# Patient Record
Sex: Female | Born: 1950 | Race: White | Hispanic: No | Smoking: Former smoker
Health system: Southern US, Community
[De-identification: ages and names within clinical notes are randomized; demographics above are authoritative.]

## PROBLEM LIST (undated history)

## (undated) DIAGNOSIS — E119 Type 2 diabetes mellitus without complications: Secondary | ICD-10-CM

## (undated) DIAGNOSIS — I679 Cerebrovascular disease, unspecified: Secondary | ICD-10-CM

## (undated) DIAGNOSIS — I2510 Atherosclerotic heart disease of native coronary artery without angina pectoris: Secondary | ICD-10-CM

## (undated) DIAGNOSIS — G20A1 Parkinson's disease without dyskinesia, without mention of fluctuations: Secondary | ICD-10-CM

## (undated) DIAGNOSIS — E785 Hyperlipidemia, unspecified: Secondary | ICD-10-CM

## (undated) DIAGNOSIS — G20 Parkinson's disease: Secondary | ICD-10-CM

## (undated) DIAGNOSIS — F0390 Unspecified dementia without behavioral disturbance: Secondary | ICD-10-CM

## (undated) HISTORY — DX: Parkinson's disease: G20

## (undated) HISTORY — DX: Parkinson's disease without dyskinesia, without mention of fluctuations: G20.A1

## (undated) HISTORY — PX: TONSILLECTOMY: SUR1361

## (undated) HISTORY — PX: TUBAL LIGATION: SHX77

## (undated) HISTORY — PX: THYROIDECTOMY: SHX17

## (undated) HISTORY — DX: Hyperlipidemia, unspecified: E78.5

## (undated) HISTORY — DX: Atherosclerotic heart disease of native coronary artery without angina pectoris: I25.10

## (undated) HISTORY — DX: Unspecified dementia, unspecified severity, without behavioral disturbance, psychotic disturbance, mood disturbance, and anxiety: F03.90

## (undated) HISTORY — DX: Cerebrovascular disease, unspecified: I67.9

## (undated) HISTORY — PX: APPENDECTOMY: SHX54

## (undated) HISTORY — PX: EXCISION MASS HEAD: SHX6702

## (undated) HISTORY — PX: EXPLORATORY LAPAROTOMY: SUR591

---

## 2016-05-01 DIAGNOSIS — F4489 Other dissociative and conversion disorders: Secondary | ICD-10-CM | POA: Diagnosis not present

## 2016-05-01 DIAGNOSIS — F329 Major depressive disorder, single episode, unspecified: Secondary | ICD-10-CM | POA: Diagnosis not present

## 2016-05-02 DIAGNOSIS — R05 Cough: Secondary | ICD-10-CM | POA: Diagnosis not present

## 2016-05-02 DIAGNOSIS — I110 Hypertensive heart disease with heart failure: Secondary | ICD-10-CM | POA: Diagnosis not present

## 2016-05-02 DIAGNOSIS — Z8673 Personal history of transient ischemic attack (TIA), and cerebral infarction without residual deficits: Secondary | ICD-10-CM | POA: Diagnosis not present

## 2016-05-02 DIAGNOSIS — Z885 Allergy status to narcotic agent status: Secondary | ICD-10-CM | POA: Diagnosis not present

## 2016-05-02 DIAGNOSIS — R509 Fever, unspecified: Secondary | ICD-10-CM | POA: Diagnosis not present

## 2016-05-02 DIAGNOSIS — E079 Disorder of thyroid, unspecified: Secondary | ICD-10-CM | POA: Diagnosis not present

## 2016-05-02 DIAGNOSIS — Z794 Long term (current) use of insulin: Secondary | ICD-10-CM | POA: Diagnosis not present

## 2016-05-02 DIAGNOSIS — K219 Gastro-esophageal reflux disease without esophagitis: Secondary | ICD-10-CM | POA: Diagnosis not present

## 2016-05-02 DIAGNOSIS — E119 Type 2 diabetes mellitus without complications: Secondary | ICD-10-CM | POA: Diagnosis not present

## 2016-05-02 DIAGNOSIS — R0981 Nasal congestion: Secondary | ICD-10-CM | POA: Diagnosis not present

## 2016-05-02 DIAGNOSIS — F0390 Unspecified dementia without behavioral disturbance: Secondary | ICD-10-CM | POA: Diagnosis not present

## 2016-05-02 DIAGNOSIS — I509 Heart failure, unspecified: Secondary | ICD-10-CM | POA: Diagnosis not present

## 2016-05-02 DIAGNOSIS — Z7982 Long term (current) use of aspirin: Secondary | ICD-10-CM | POA: Diagnosis not present

## 2016-05-02 DIAGNOSIS — Z90710 Acquired absence of both cervix and uterus: Secondary | ICD-10-CM | POA: Diagnosis not present

## 2016-05-02 DIAGNOSIS — E785 Hyperlipidemia, unspecified: Secondary | ICD-10-CM | POA: Diagnosis not present

## 2016-05-02 DIAGNOSIS — Z9049 Acquired absence of other specified parts of digestive tract: Secondary | ICD-10-CM | POA: Diagnosis not present

## 2016-05-02 DIAGNOSIS — I10 Essential (primary) hypertension: Secondary | ICD-10-CM | POA: Diagnosis not present

## 2016-05-02 DIAGNOSIS — B349 Viral infection, unspecified: Secondary | ICD-10-CM | POA: Diagnosis not present

## 2016-05-02 DIAGNOSIS — B1920 Unspecified viral hepatitis C without hepatic coma: Secondary | ICD-10-CM | POA: Diagnosis not present

## 2016-05-03 DIAGNOSIS — R4182 Altered mental status, unspecified: Secondary | ICD-10-CM | POA: Diagnosis not present

## 2016-05-12 DIAGNOSIS — M79674 Pain in right toe(s): Secondary | ICD-10-CM | POA: Diagnosis not present

## 2016-05-12 DIAGNOSIS — M205X1 Other deformities of toe(s) (acquired), right foot: Secondary | ICD-10-CM | POA: Diagnosis not present

## 2016-05-12 DIAGNOSIS — M19071 Primary osteoarthritis, right ankle and foot: Secondary | ICD-10-CM | POA: Diagnosis not present

## 2016-05-12 DIAGNOSIS — E1151 Type 2 diabetes mellitus with diabetic peripheral angiopathy without gangrene: Secondary | ICD-10-CM | POA: Diagnosis not present

## 2016-05-18 DIAGNOSIS — E1121 Type 2 diabetes mellitus with diabetic nephropathy: Secondary | ICD-10-CM | POA: Diagnosis not present

## 2016-05-18 DIAGNOSIS — I129 Hypertensive chronic kidney disease with stage 1 through stage 4 chronic kidney disease, or unspecified chronic kidney disease: Secondary | ICD-10-CM | POA: Diagnosis not present

## 2016-05-18 DIAGNOSIS — N183 Chronic kidney disease, stage 3 (moderate): Secondary | ICD-10-CM | POA: Diagnosis not present

## 2016-05-18 DIAGNOSIS — R609 Edema, unspecified: Secondary | ICD-10-CM | POA: Diagnosis not present

## 2016-05-18 DIAGNOSIS — E213 Hyperparathyroidism, unspecified: Secondary | ICD-10-CM | POA: Diagnosis not present

## 2016-05-23 DIAGNOSIS — I2510 Atherosclerotic heart disease of native coronary artery without angina pectoris: Secondary | ICD-10-CM | POA: Diagnosis not present

## 2016-05-23 DIAGNOSIS — E559 Vitamin D deficiency, unspecified: Secondary | ICD-10-CM | POA: Diagnosis not present

## 2016-05-23 DIAGNOSIS — C73 Malignant neoplasm of thyroid gland: Secondary | ICD-10-CM | POA: Diagnosis not present

## 2016-05-23 DIAGNOSIS — E1121 Type 2 diabetes mellitus with diabetic nephropathy: Secondary | ICD-10-CM | POA: Diagnosis not present

## 2016-05-23 DIAGNOSIS — Z23 Encounter for immunization: Secondary | ICD-10-CM | POA: Diagnosis not present

## 2016-05-31 DIAGNOSIS — F419 Anxiety disorder, unspecified: Secondary | ICD-10-CM | POA: Diagnosis not present

## 2016-05-31 DIAGNOSIS — R419 Unspecified symptoms and signs involving cognitive functions and awareness: Secondary | ICD-10-CM | POA: Diagnosis not present

## 2016-05-31 DIAGNOSIS — F329 Major depressive disorder, single episode, unspecified: Secondary | ICD-10-CM | POA: Diagnosis not present

## 2016-06-06 DIAGNOSIS — I10 Essential (primary) hypertension: Secondary | ICD-10-CM | POA: Diagnosis not present

## 2016-06-06 DIAGNOSIS — E119 Type 2 diabetes mellitus without complications: Secondary | ICD-10-CM | POA: Diagnosis not present

## 2016-06-06 DIAGNOSIS — E785 Hyperlipidemia, unspecified: Secondary | ICD-10-CM | POA: Diagnosis not present

## 2016-06-06 DIAGNOSIS — N189 Chronic kidney disease, unspecified: Secondary | ICD-10-CM | POA: Diagnosis not present

## 2016-06-06 DIAGNOSIS — I2510 Atherosclerotic heart disease of native coronary artery without angina pectoris: Secondary | ICD-10-CM | POA: Diagnosis not present

## 2016-06-06 DIAGNOSIS — Z9861 Coronary angioplasty status: Secondary | ICD-10-CM | POA: Diagnosis not present

## 2016-06-08 DIAGNOSIS — R609 Edema, unspecified: Secondary | ICD-10-CM | POA: Diagnosis not present

## 2016-06-08 DIAGNOSIS — I129 Hypertensive chronic kidney disease with stage 1 through stage 4 chronic kidney disease, or unspecified chronic kidney disease: Secondary | ICD-10-CM | POA: Diagnosis not present

## 2016-06-08 DIAGNOSIS — E213 Hyperparathyroidism, unspecified: Secondary | ICD-10-CM | POA: Diagnosis not present

## 2016-06-08 DIAGNOSIS — N183 Chronic kidney disease, stage 3 (moderate): Secondary | ICD-10-CM | POA: Diagnosis not present

## 2016-06-08 DIAGNOSIS — E1121 Type 2 diabetes mellitus with diabetic nephropathy: Secondary | ICD-10-CM | POA: Diagnosis not present

## 2016-06-15 DIAGNOSIS — E892 Postprocedural hypoparathyroidism: Secondary | ICD-10-CM | POA: Diagnosis not present

## 2016-06-15 DIAGNOSIS — M810 Age-related osteoporosis without current pathological fracture: Secondary | ICD-10-CM | POA: Diagnosis not present

## 2016-06-15 DIAGNOSIS — E1165 Type 2 diabetes mellitus with hyperglycemia: Secondary | ICD-10-CM | POA: Diagnosis not present

## 2016-06-15 DIAGNOSIS — Z85850 Personal history of malignant neoplasm of thyroid: Secondary | ICD-10-CM | POA: Diagnosis not present

## 2016-06-15 DIAGNOSIS — E890 Postprocedural hypothyroidism: Secondary | ICD-10-CM | POA: Diagnosis not present

## 2016-06-21 DIAGNOSIS — I2510 Atherosclerotic heart disease of native coronary artery without angina pectoris: Secondary | ICD-10-CM | POA: Diagnosis not present

## 2016-06-21 DIAGNOSIS — Z6829 Body mass index (BMI) 29.0-29.9, adult: Secondary | ICD-10-CM | POA: Diagnosis not present

## 2016-06-21 DIAGNOSIS — C73 Malignant neoplasm of thyroid gland: Secondary | ICD-10-CM | POA: Diagnosis not present

## 2016-06-21 DIAGNOSIS — E1121 Type 2 diabetes mellitus with diabetic nephropathy: Secondary | ICD-10-CM | POA: Diagnosis not present

## 2016-06-26 DIAGNOSIS — R251 Tremor, unspecified: Secondary | ICD-10-CM | POA: Diagnosis not present

## 2016-06-26 DIAGNOSIS — G20 Parkinson's disease: Secondary | ICD-10-CM | POA: Diagnosis not present

## 2016-06-26 DIAGNOSIS — R209 Unspecified disturbances of skin sensation: Secondary | ICD-10-CM | POA: Diagnosis not present

## 2016-06-28 DIAGNOSIS — F331 Major depressive disorder, recurrent, moderate: Secondary | ICD-10-CM | POA: Diagnosis not present

## 2016-06-28 DIAGNOSIS — R419 Unspecified symptoms and signs involving cognitive functions and awareness: Secondary | ICD-10-CM | POA: Diagnosis not present

## 2016-06-28 DIAGNOSIS — Z598 Other problems related to housing and economic circumstances: Secondary | ICD-10-CM | POA: Diagnosis not present

## 2016-06-28 DIAGNOSIS — F419 Anxiety disorder, unspecified: Secondary | ICD-10-CM | POA: Diagnosis not present

## 2016-07-11 DIAGNOSIS — I69393 Ataxia following cerebral infarction: Secondary | ICD-10-CM | POA: Diagnosis not present

## 2016-07-11 DIAGNOSIS — R9409 Abnormal results of other function studies of central nervous system: Secondary | ICD-10-CM | POA: Diagnosis not present

## 2016-07-11 DIAGNOSIS — R251 Tremor, unspecified: Secondary | ICD-10-CM | POA: Diagnosis not present

## 2016-07-11 DIAGNOSIS — R9402 Abnormal brain scan: Secondary | ICD-10-CM | POA: Diagnosis not present

## 2016-07-19 DIAGNOSIS — I10 Essential (primary) hypertension: Secondary | ICD-10-CM | POA: Diagnosis not present

## 2016-07-19 DIAGNOSIS — Z6829 Body mass index (BMI) 29.0-29.9, adult: Secondary | ICD-10-CM | POA: Diagnosis not present

## 2016-07-19 DIAGNOSIS — I2510 Atherosclerotic heart disease of native coronary artery without angina pectoris: Secondary | ICD-10-CM | POA: Diagnosis not present

## 2016-07-19 DIAGNOSIS — C73 Malignant neoplasm of thyroid gland: Secondary | ICD-10-CM | POA: Diagnosis not present

## 2016-07-28 DIAGNOSIS — F331 Major depressive disorder, recurrent, moderate: Secondary | ICD-10-CM | POA: Diagnosis not present

## 2016-07-28 DIAGNOSIS — F419 Anxiety disorder, unspecified: Secondary | ICD-10-CM | POA: Diagnosis not present

## 2016-07-28 DIAGNOSIS — Z598 Other problems related to housing and economic circumstances: Secondary | ICD-10-CM | POA: Diagnosis not present

## 2016-07-28 DIAGNOSIS — R419 Unspecified symptoms and signs involving cognitive functions and awareness: Secondary | ICD-10-CM | POA: Diagnosis not present

## 2016-08-09 DIAGNOSIS — Z1389 Encounter for screening for other disorder: Secondary | ICD-10-CM | POA: Diagnosis not present

## 2016-08-09 DIAGNOSIS — R350 Frequency of micturition: Secondary | ICD-10-CM | POA: Diagnosis not present

## 2016-08-09 DIAGNOSIS — N390 Urinary tract infection, site not specified: Secondary | ICD-10-CM | POA: Diagnosis not present

## 2016-08-09 DIAGNOSIS — Z13220 Encounter for screening for lipoid disorders: Secondary | ICD-10-CM | POA: Diagnosis not present

## 2016-08-13 DIAGNOSIS — N183 Chronic kidney disease, stage 3 (moderate): Secondary | ICD-10-CM | POA: Diagnosis not present

## 2016-08-13 DIAGNOSIS — Z66 Do not resuscitate: Secondary | ICD-10-CM | POA: Diagnosis not present

## 2016-08-13 DIAGNOSIS — R748 Abnormal levels of other serum enzymes: Secondary | ICD-10-CM | POA: Diagnosis not present

## 2016-08-13 DIAGNOSIS — F0390 Unspecified dementia without behavioral disturbance: Secondary | ICD-10-CM | POA: Diagnosis not present

## 2016-08-13 DIAGNOSIS — I10 Essential (primary) hypertension: Secondary | ICD-10-CM | POA: Diagnosis not present

## 2016-08-13 DIAGNOSIS — K529 Noninfective gastroenteritis and colitis, unspecified: Secondary | ICD-10-CM | POA: Diagnosis not present

## 2016-08-13 DIAGNOSIS — R1110 Vomiting, unspecified: Secondary | ICD-10-CM | POA: Diagnosis not present

## 2016-08-13 DIAGNOSIS — R1010 Upper abdominal pain, unspecified: Secondary | ICD-10-CM | POA: Diagnosis not present

## 2016-08-13 DIAGNOSIS — Z955 Presence of coronary angioplasty implant and graft: Secondary | ICD-10-CM | POA: Diagnosis not present

## 2016-08-13 DIAGNOSIS — R1011 Right upper quadrant pain: Secondary | ICD-10-CM | POA: Diagnosis not present

## 2016-08-13 DIAGNOSIS — I129 Hypertensive chronic kidney disease with stage 1 through stage 4 chronic kidney disease, or unspecified chronic kidney disease: Secondary | ICD-10-CM | POA: Diagnosis not present

## 2016-08-13 DIAGNOSIS — A084 Viral intestinal infection, unspecified: Secondary | ICD-10-CM | POA: Diagnosis not present

## 2016-08-13 DIAGNOSIS — E785 Hyperlipidemia, unspecified: Secondary | ICD-10-CM | POA: Diagnosis not present

## 2016-08-13 DIAGNOSIS — Z7902 Long term (current) use of antithrombotics/antiplatelets: Secondary | ICD-10-CM | POA: Diagnosis not present

## 2016-08-13 DIAGNOSIS — R0602 Shortness of breath: Secondary | ICD-10-CM | POA: Diagnosis not present

## 2016-08-13 DIAGNOSIS — G4089 Other seizures: Secondary | ICD-10-CM | POA: Diagnosis not present

## 2016-08-13 DIAGNOSIS — I2510 Atherosclerotic heart disease of native coronary artery without angina pectoris: Secondary | ICD-10-CM | POA: Diagnosis not present

## 2016-08-13 DIAGNOSIS — D696 Thrombocytopenia, unspecified: Secondary | ICD-10-CM | POA: Diagnosis not present

## 2016-08-13 DIAGNOSIS — Z90710 Acquired absence of both cervix and uterus: Secondary | ICD-10-CM | POA: Diagnosis not present

## 2016-08-13 DIAGNOSIS — R197 Diarrhea, unspecified: Secondary | ICD-10-CM | POA: Diagnosis not present

## 2016-08-13 DIAGNOSIS — F5104 Psychophysiologic insomnia: Secondary | ICD-10-CM | POA: Diagnosis not present

## 2016-08-13 DIAGNOSIS — E1142 Type 2 diabetes mellitus with diabetic polyneuropathy: Secondary | ICD-10-CM | POA: Diagnosis not present

## 2016-08-13 DIAGNOSIS — Z9049 Acquired absence of other specified parts of digestive tract: Secondary | ICD-10-CM | POA: Diagnosis not present

## 2016-08-13 DIAGNOSIS — Z7982 Long term (current) use of aspirin: Secondary | ICD-10-CM | POA: Diagnosis not present

## 2016-08-13 DIAGNOSIS — Z8669 Personal history of other diseases of the nervous system and sense organs: Secondary | ICD-10-CM | POA: Diagnosis not present

## 2016-08-13 DIAGNOSIS — R160 Hepatomegaly, not elsewhere classified: Secondary | ICD-10-CM | POA: Diagnosis not present

## 2016-08-13 DIAGNOSIS — F419 Anxiety disorder, unspecified: Secondary | ICD-10-CM | POA: Diagnosis not present

## 2016-08-13 DIAGNOSIS — D708 Other neutropenia: Secondary | ICD-10-CM | POA: Diagnosis not present

## 2016-08-13 DIAGNOSIS — E1122 Type 2 diabetes mellitus with diabetic chronic kidney disease: Secondary | ICD-10-CM | POA: Diagnosis not present

## 2016-08-13 DIAGNOSIS — M1990 Unspecified osteoarthritis, unspecified site: Secondary | ICD-10-CM | POA: Diagnosis not present

## 2016-08-13 DIAGNOSIS — R740 Nonspecific elevation of levels of transaminase and lactic acid dehydrogenase [LDH]: Secondary | ICD-10-CM | POA: Diagnosis not present

## 2016-08-13 DIAGNOSIS — K759 Inflammatory liver disease, unspecified: Secondary | ICD-10-CM | POA: Diagnosis not present

## 2016-08-13 DIAGNOSIS — R51 Headache: Secondary | ICD-10-CM | POA: Diagnosis not present

## 2016-08-13 DIAGNOSIS — R112 Nausea with vomiting, unspecified: Secondary | ICD-10-CM | POA: Diagnosis not present

## 2016-08-13 DIAGNOSIS — Z8619 Personal history of other infectious and parasitic diseases: Secondary | ICD-10-CM | POA: Diagnosis not present

## 2016-08-13 DIAGNOSIS — E039 Hypothyroidism, unspecified: Secondary | ICD-10-CM | POA: Diagnosis not present

## 2016-08-14 DIAGNOSIS — K759 Inflammatory liver disease, unspecified: Secondary | ICD-10-CM | POA: Diagnosis not present

## 2016-08-14 DIAGNOSIS — R0602 Shortness of breath: Secondary | ICD-10-CM | POA: Diagnosis not present

## 2016-08-14 DIAGNOSIS — K529 Noninfective gastroenteritis and colitis, unspecified: Secondary | ICD-10-CM | POA: Diagnosis not present

## 2016-08-14 DIAGNOSIS — F419 Anxiety disorder, unspecified: Secondary | ICD-10-CM | POA: Diagnosis not present

## 2016-08-14 DIAGNOSIS — E039 Hypothyroidism, unspecified: Secondary | ICD-10-CM | POA: Diagnosis not present

## 2016-08-14 DIAGNOSIS — I10 Essential (primary) hypertension: Secondary | ICD-10-CM | POA: Diagnosis not present

## 2016-08-14 DIAGNOSIS — N183 Chronic kidney disease, stage 3 (moderate): Secondary | ICD-10-CM | POA: Diagnosis not present

## 2016-08-16 DIAGNOSIS — I129 Hypertensive chronic kidney disease with stage 1 through stage 4 chronic kidney disease, or unspecified chronic kidney disease: Secondary | ICD-10-CM | POA: Diagnosis not present

## 2016-08-16 DIAGNOSIS — N183 Chronic kidney disease, stage 3 (moderate): Secondary | ICD-10-CM | POA: Diagnosis not present

## 2016-08-16 DIAGNOSIS — E1122 Type 2 diabetes mellitus with diabetic chronic kidney disease: Secondary | ICD-10-CM | POA: Diagnosis not present

## 2016-08-17 DIAGNOSIS — M25571 Pain in right ankle and joints of right foot: Secondary | ICD-10-CM | POA: Diagnosis not present

## 2016-08-17 DIAGNOSIS — M79675 Pain in left toe(s): Secondary | ICD-10-CM | POA: Diagnosis not present

## 2016-08-17 DIAGNOSIS — E1151 Type 2 diabetes mellitus with diabetic peripheral angiopathy without gangrene: Secondary | ICD-10-CM | POA: Diagnosis not present

## 2016-08-17 DIAGNOSIS — R29898 Other symptoms and signs involving the musculoskeletal system: Secondary | ICD-10-CM | POA: Diagnosis not present

## 2016-08-17 DIAGNOSIS — B351 Tinea unguium: Secondary | ICD-10-CM | POA: Diagnosis not present

## 2016-08-17 DIAGNOSIS — M79674 Pain in right toe(s): Secondary | ICD-10-CM | POA: Diagnosis not present

## 2016-08-17 DIAGNOSIS — M21371 Foot drop, right foot: Secondary | ICD-10-CM | POA: Diagnosis not present

## 2016-08-17 DIAGNOSIS — G8191 Hemiplegia, unspecified affecting right dominant side: Secondary | ICD-10-CM | POA: Diagnosis not present

## 2016-08-18 DIAGNOSIS — Z79899 Other long term (current) drug therapy: Secondary | ICD-10-CM | POA: Diagnosis not present

## 2016-08-18 DIAGNOSIS — K529 Noninfective gastroenteritis and colitis, unspecified: Secondary | ICD-10-CM | POA: Diagnosis not present

## 2016-08-18 DIAGNOSIS — F419 Anxiety disorder, unspecified: Secondary | ICD-10-CM | POA: Diagnosis not present

## 2016-08-18 DIAGNOSIS — I2510 Atherosclerotic heart disease of native coronary artery without angina pectoris: Secondary | ICD-10-CM | POA: Diagnosis not present

## 2016-08-18 DIAGNOSIS — R945 Abnormal results of liver function studies: Secondary | ICD-10-CM | POA: Diagnosis not present

## 2016-08-18 DIAGNOSIS — I10 Essential (primary) hypertension: Secondary | ICD-10-CM | POA: Diagnosis not present

## 2016-08-21 DIAGNOSIS — R209 Unspecified disturbances of skin sensation: Secondary | ICD-10-CM | POA: Diagnosis not present

## 2016-08-21 DIAGNOSIS — G20 Parkinson's disease: Secondary | ICD-10-CM | POA: Diagnosis not present

## 2016-08-25 DIAGNOSIS — Z6829 Body mass index (BMI) 29.0-29.9, adult: Secondary | ICD-10-CM | POA: Diagnosis not present

## 2016-08-25 DIAGNOSIS — N189 Chronic kidney disease, unspecified: Secondary | ICD-10-CM | POA: Diagnosis not present

## 2016-08-25 DIAGNOSIS — R945 Abnormal results of liver function studies: Secondary | ICD-10-CM | POA: Diagnosis not present

## 2016-08-25 DIAGNOSIS — Z79899 Other long term (current) drug therapy: Secondary | ICD-10-CM | POA: Diagnosis not present

## 2016-08-25 DIAGNOSIS — C73 Malignant neoplasm of thyroid gland: Secondary | ICD-10-CM | POA: Diagnosis not present

## 2016-08-28 DIAGNOSIS — F331 Major depressive disorder, recurrent, moderate: Secondary | ICD-10-CM | POA: Diagnosis not present

## 2016-08-28 DIAGNOSIS — R419 Unspecified symptoms and signs involving cognitive functions and awareness: Secondary | ICD-10-CM | POA: Diagnosis not present

## 2016-08-28 DIAGNOSIS — F419 Anxiety disorder, unspecified: Secondary | ICD-10-CM | POA: Diagnosis not present

## 2016-08-28 DIAGNOSIS — Z598 Other problems related to housing and economic circumstances: Secondary | ICD-10-CM | POA: Diagnosis not present

## 2016-08-30 DIAGNOSIS — M810 Age-related osteoporosis without current pathological fracture: Secondary | ICD-10-CM | POA: Diagnosis not present

## 2016-08-30 DIAGNOSIS — E890 Postprocedural hypothyroidism: Secondary | ICD-10-CM | POA: Diagnosis not present

## 2016-08-30 DIAGNOSIS — E1165 Type 2 diabetes mellitus with hyperglycemia: Secondary | ICD-10-CM | POA: Diagnosis not present

## 2016-08-30 DIAGNOSIS — E892 Postprocedural hypoparathyroidism: Secondary | ICD-10-CM | POA: Diagnosis not present

## 2016-08-30 DIAGNOSIS — Z85850 Personal history of malignant neoplasm of thyroid: Secondary | ICD-10-CM | POA: Diagnosis not present

## 2016-09-15 DIAGNOSIS — Z6829 Body mass index (BMI) 29.0-29.9, adult: Secondary | ICD-10-CM | POA: Diagnosis not present

## 2016-09-15 DIAGNOSIS — N189 Chronic kidney disease, unspecified: Secondary | ICD-10-CM | POA: Diagnosis not present

## 2016-09-15 DIAGNOSIS — F419 Anxiety disorder, unspecified: Secondary | ICD-10-CM | POA: Diagnosis not present

## 2016-09-15 DIAGNOSIS — C73 Malignant neoplasm of thyroid gland: Secondary | ICD-10-CM | POA: Diagnosis not present

## 2016-09-27 DIAGNOSIS — Z598 Other problems related to housing and economic circumstances: Secondary | ICD-10-CM | POA: Diagnosis not present

## 2016-09-27 DIAGNOSIS — F419 Anxiety disorder, unspecified: Secondary | ICD-10-CM | POA: Diagnosis not present

## 2016-09-27 DIAGNOSIS — F331 Major depressive disorder, recurrent, moderate: Secondary | ICD-10-CM | POA: Diagnosis not present

## 2016-09-27 DIAGNOSIS — R419 Unspecified symptoms and signs involving cognitive functions and awareness: Secondary | ICD-10-CM | POA: Diagnosis not present

## 2016-10-11 DIAGNOSIS — I2510 Atherosclerotic heart disease of native coronary artery without angina pectoris: Secondary | ICD-10-CM | POA: Diagnosis not present

## 2016-10-11 DIAGNOSIS — E785 Hyperlipidemia, unspecified: Secondary | ICD-10-CM | POA: Diagnosis not present

## 2016-10-11 DIAGNOSIS — I10 Essential (primary) hypertension: Secondary | ICD-10-CM | POA: Diagnosis not present

## 2016-10-11 DIAGNOSIS — E784 Other hyperlipidemia: Secondary | ICD-10-CM | POA: Diagnosis not present

## 2016-10-11 DIAGNOSIS — C73 Malignant neoplasm of thyroid gland: Secondary | ICD-10-CM | POA: Diagnosis not present

## 2016-10-11 DIAGNOSIS — N189 Chronic kidney disease, unspecified: Secondary | ICD-10-CM | POA: Diagnosis not present

## 2016-10-11 DIAGNOSIS — E119 Type 2 diabetes mellitus without complications: Secondary | ICD-10-CM | POA: Diagnosis not present

## 2016-10-20 DIAGNOSIS — S0990XA Unspecified injury of head, initial encounter: Secondary | ICD-10-CM | POA: Diagnosis not present

## 2016-10-20 DIAGNOSIS — E079 Disorder of thyroid, unspecified: Secondary | ICD-10-CM | POA: Diagnosis not present

## 2016-10-20 DIAGNOSIS — B1920 Unspecified viral hepatitis C without hepatic coma: Secondary | ICD-10-CM | POA: Diagnosis not present

## 2016-10-20 DIAGNOSIS — I10 Essential (primary) hypertension: Secondary | ICD-10-CM | POA: Diagnosis not present

## 2016-10-20 DIAGNOSIS — R531 Weakness: Secondary | ICD-10-CM | POA: Diagnosis not present

## 2016-10-20 DIAGNOSIS — Z8673 Personal history of transient ischemic attack (TIA), and cerebral infarction without residual deficits: Secondary | ICD-10-CM | POA: Diagnosis not present

## 2016-10-20 DIAGNOSIS — G319 Degenerative disease of nervous system, unspecified: Secondary | ICD-10-CM | POA: Diagnosis not present

## 2016-10-20 DIAGNOSIS — F0390 Unspecified dementia without behavioral disturbance: Secondary | ICD-10-CM | POA: Diagnosis not present

## 2016-10-20 DIAGNOSIS — I2510 Atherosclerotic heart disease of native coronary artery without angina pectoris: Secondary | ICD-10-CM | POA: Diagnosis not present

## 2016-10-20 DIAGNOSIS — Z7902 Long term (current) use of antithrombotics/antiplatelets: Secondary | ICD-10-CM | POA: Diagnosis not present

## 2016-10-20 DIAGNOSIS — I252 Old myocardial infarction: Secondary | ICD-10-CM | POA: Diagnosis not present

## 2016-10-20 DIAGNOSIS — Z794 Long term (current) use of insulin: Secondary | ICD-10-CM | POA: Diagnosis not present

## 2016-10-20 DIAGNOSIS — R569 Unspecified convulsions: Secondary | ICD-10-CM | POA: Diagnosis not present

## 2016-10-20 DIAGNOSIS — K219 Gastro-esophageal reflux disease without esophagitis: Secondary | ICD-10-CM | POA: Diagnosis not present

## 2016-10-20 DIAGNOSIS — I110 Hypertensive heart disease with heart failure: Secondary | ICD-10-CM | POA: Diagnosis not present

## 2016-10-20 DIAGNOSIS — E785 Hyperlipidemia, unspecified: Secondary | ICD-10-CM | POA: Diagnosis not present

## 2016-10-20 DIAGNOSIS — E119 Type 2 diabetes mellitus without complications: Secondary | ICD-10-CM | POA: Diagnosis not present

## 2016-10-20 DIAGNOSIS — I509 Heart failure, unspecified: Secondary | ICD-10-CM | POA: Diagnosis not present

## 2016-10-20 DIAGNOSIS — W1839XA Other fall on same level, initial encounter: Secondary | ICD-10-CM | POA: Diagnosis not present

## 2016-10-20 DIAGNOSIS — F445 Conversion disorder with seizures or convulsions: Secondary | ICD-10-CM | POA: Diagnosis not present

## 2016-10-20 DIAGNOSIS — M47812 Spondylosis without myelopathy or radiculopathy, cervical region: Secondary | ICD-10-CM | POA: Diagnosis not present

## 2016-10-23 DIAGNOSIS — Z598 Other problems related to housing and economic circumstances: Secondary | ICD-10-CM | POA: Diagnosis not present

## 2016-10-23 DIAGNOSIS — F331 Major depressive disorder, recurrent, moderate: Secondary | ICD-10-CM | POA: Diagnosis not present

## 2016-10-23 DIAGNOSIS — F419 Anxiety disorder, unspecified: Secondary | ICD-10-CM | POA: Diagnosis not present

## 2016-10-23 DIAGNOSIS — R419 Unspecified symptoms and signs involving cognitive functions and awareness: Secondary | ICD-10-CM | POA: Diagnosis not present

## 2016-10-24 DIAGNOSIS — Z794 Long term (current) use of insulin: Secondary | ICD-10-CM | POA: Diagnosis not present

## 2016-10-24 DIAGNOSIS — R55 Syncope and collapse: Secondary | ICD-10-CM | POA: Diagnosis not present

## 2016-10-24 DIAGNOSIS — E1122 Type 2 diabetes mellitus with diabetic chronic kidney disease: Secondary | ICD-10-CM | POA: Diagnosis not present

## 2016-10-24 DIAGNOSIS — R531 Weakness: Secondary | ICD-10-CM | POA: Diagnosis not present

## 2016-10-24 DIAGNOSIS — E1165 Type 2 diabetes mellitus with hyperglycemia: Secondary | ICD-10-CM | POA: Diagnosis not present

## 2016-10-24 DIAGNOSIS — E785 Hyperlipidemia, unspecified: Secondary | ICD-10-CM | POA: Diagnosis not present

## 2016-10-24 DIAGNOSIS — S0990XA Unspecified injury of head, initial encounter: Secondary | ICD-10-CM | POA: Diagnosis not present

## 2016-10-24 DIAGNOSIS — E1129 Type 2 diabetes mellitus with other diabetic kidney complication: Secondary | ICD-10-CM | POA: Diagnosis not present

## 2016-10-24 DIAGNOSIS — K219 Gastro-esophageal reflux disease without esophagitis: Secondary | ICD-10-CM | POA: Diagnosis not present

## 2016-10-24 DIAGNOSIS — N189 Chronic kidney disease, unspecified: Secondary | ICD-10-CM | POA: Diagnosis not present

## 2016-10-24 DIAGNOSIS — B1920 Unspecified viral hepatitis C without hepatic coma: Secondary | ICD-10-CM | POA: Diagnosis not present

## 2016-10-24 DIAGNOSIS — I509 Heart failure, unspecified: Secondary | ICD-10-CM | POA: Diagnosis not present

## 2016-10-24 DIAGNOSIS — Z8673 Personal history of transient ischemic attack (TIA), and cerebral infarction without residual deficits: Secondary | ICD-10-CM | POA: Diagnosis not present

## 2016-10-24 DIAGNOSIS — F0390 Unspecified dementia without behavioral disturbance: Secondary | ICD-10-CM | POA: Diagnosis not present

## 2016-10-24 DIAGNOSIS — M47812 Spondylosis without myelopathy or radiculopathy, cervical region: Secondary | ICD-10-CM | POA: Diagnosis not present

## 2016-10-24 DIAGNOSIS — R569 Unspecified convulsions: Secondary | ICD-10-CM | POA: Diagnosis not present

## 2016-10-24 DIAGNOSIS — I2510 Atherosclerotic heart disease of native coronary artery without angina pectoris: Secondary | ICD-10-CM | POA: Diagnosis not present

## 2016-10-24 DIAGNOSIS — I130 Hypertensive heart and chronic kidney disease with heart failure and stage 1 through stage 4 chronic kidney disease, or unspecified chronic kidney disease: Secondary | ICD-10-CM | POA: Diagnosis not present

## 2016-10-24 DIAGNOSIS — E079 Disorder of thyroid, unspecified: Secondary | ICD-10-CM | POA: Diagnosis not present

## 2016-11-02 DIAGNOSIS — N183 Chronic kidney disease, stage 3 (moderate): Secondary | ICD-10-CM | POA: Diagnosis not present

## 2016-11-02 DIAGNOSIS — E1122 Type 2 diabetes mellitus with diabetic chronic kidney disease: Secondary | ICD-10-CM | POA: Diagnosis not present

## 2016-11-09 DIAGNOSIS — N189 Chronic kidney disease, unspecified: Secondary | ICD-10-CM | POA: Diagnosis not present

## 2016-11-09 DIAGNOSIS — C73 Malignant neoplasm of thyroid gland: Secondary | ICD-10-CM | POA: Diagnosis not present

## 2016-11-09 DIAGNOSIS — L03818 Cellulitis of other sites: Secondary | ICD-10-CM | POA: Diagnosis not present

## 2016-11-09 DIAGNOSIS — Z9861 Coronary angioplasty status: Secondary | ICD-10-CM | POA: Diagnosis not present

## 2016-11-15 DIAGNOSIS — N183 Chronic kidney disease, stage 3 (moderate): Secondary | ICD-10-CM | POA: Diagnosis not present

## 2016-11-15 DIAGNOSIS — I129 Hypertensive chronic kidney disease with stage 1 through stage 4 chronic kidney disease, or unspecified chronic kidney disease: Secondary | ICD-10-CM | POA: Diagnosis not present

## 2016-11-15 DIAGNOSIS — E782 Mixed hyperlipidemia: Secondary | ICD-10-CM | POA: Diagnosis not present

## 2016-11-16 DIAGNOSIS — R600 Localized edema: Secondary | ICD-10-CM | POA: Diagnosis not present

## 2016-11-16 DIAGNOSIS — B351 Tinea unguium: Secondary | ICD-10-CM | POA: Diagnosis not present

## 2016-11-16 DIAGNOSIS — M79674 Pain in right toe(s): Secondary | ICD-10-CM | POA: Diagnosis not present

## 2016-11-16 DIAGNOSIS — E1151 Type 2 diabetes mellitus with diabetic peripheral angiopathy without gangrene: Secondary | ICD-10-CM | POA: Diagnosis not present

## 2016-11-20 DIAGNOSIS — R2689 Other abnormalities of gait and mobility: Secondary | ICD-10-CM | POA: Diagnosis not present

## 2016-11-20 DIAGNOSIS — R251 Tremor, unspecified: Secondary | ICD-10-CM | POA: Diagnosis not present

## 2016-11-20 DIAGNOSIS — G20 Parkinson's disease: Secondary | ICD-10-CM | POA: Diagnosis not present

## 2016-11-22 DIAGNOSIS — R419 Unspecified symptoms and signs involving cognitive functions and awareness: Secondary | ICD-10-CM | POA: Diagnosis not present

## 2016-11-22 DIAGNOSIS — F331 Major depressive disorder, recurrent, moderate: Secondary | ICD-10-CM | POA: Diagnosis not present

## 2016-11-22 DIAGNOSIS — F419 Anxiety disorder, unspecified: Secondary | ICD-10-CM | POA: Diagnosis not present

## 2016-11-22 DIAGNOSIS — Z598 Other problems related to housing and economic circumstances: Secondary | ICD-10-CM | POA: Diagnosis not present

## 2016-12-01 DIAGNOSIS — E892 Postprocedural hypoparathyroidism: Secondary | ICD-10-CM | POA: Diagnosis not present

## 2016-12-01 DIAGNOSIS — I2510 Atherosclerotic heart disease of native coronary artery without angina pectoris: Secondary | ICD-10-CM | POA: Diagnosis not present

## 2016-12-01 DIAGNOSIS — C73 Malignant neoplasm of thyroid gland: Secondary | ICD-10-CM | POA: Diagnosis not present

## 2016-12-01 DIAGNOSIS — Z794 Long term (current) use of insulin: Secondary | ICD-10-CM | POA: Diagnosis not present

## 2016-12-01 DIAGNOSIS — E1165 Type 2 diabetes mellitus with hyperglycemia: Secondary | ICD-10-CM | POA: Diagnosis not present

## 2016-12-01 DIAGNOSIS — N183 Chronic kidney disease, stage 3 (moderate): Secondary | ICD-10-CM | POA: Diagnosis not present

## 2016-12-01 DIAGNOSIS — M810 Age-related osteoporosis without current pathological fracture: Secondary | ICD-10-CM | POA: Diagnosis not present

## 2016-12-01 DIAGNOSIS — E890 Postprocedural hypothyroidism: Secondary | ICD-10-CM | POA: Diagnosis not present

## 2016-12-01 DIAGNOSIS — Z6826 Body mass index (BMI) 26.0-26.9, adult: Secondary | ICD-10-CM | POA: Diagnosis not present

## 2016-12-01 DIAGNOSIS — Z85850 Personal history of malignant neoplasm of thyroid: Secondary | ICD-10-CM | POA: Diagnosis not present

## 2016-12-13 DIAGNOSIS — E892 Postprocedural hypoparathyroidism: Secondary | ICD-10-CM | POA: Diagnosis not present

## 2016-12-13 DIAGNOSIS — E890 Postprocedural hypothyroidism: Secondary | ICD-10-CM | POA: Diagnosis not present

## 2016-12-13 DIAGNOSIS — Z794 Long term (current) use of insulin: Secondary | ICD-10-CM | POA: Diagnosis not present

## 2016-12-13 DIAGNOSIS — E1165 Type 2 diabetes mellitus with hyperglycemia: Secondary | ICD-10-CM | POA: Diagnosis not present

## 2016-12-13 DIAGNOSIS — Z85850 Personal history of malignant neoplasm of thyroid: Secondary | ICD-10-CM | POA: Diagnosis not present

## 2016-12-13 DIAGNOSIS — M810 Age-related osteoporosis without current pathological fracture: Secondary | ICD-10-CM | POA: Diagnosis not present

## 2016-12-18 DIAGNOSIS — M810 Age-related osteoporosis without current pathological fracture: Secondary | ICD-10-CM | POA: Diagnosis not present

## 2016-12-20 DIAGNOSIS — R269 Unspecified abnormalities of gait and mobility: Secondary | ICD-10-CM | POA: Diagnosis not present

## 2016-12-20 DIAGNOSIS — Z9181 History of falling: Secondary | ICD-10-CM | POA: Diagnosis not present

## 2016-12-21 DIAGNOSIS — F331 Major depressive disorder, recurrent, moderate: Secondary | ICD-10-CM | POA: Diagnosis not present

## 2016-12-21 DIAGNOSIS — R419 Unspecified symptoms and signs involving cognitive functions and awareness: Secondary | ICD-10-CM | POA: Diagnosis not present

## 2016-12-21 DIAGNOSIS — Z598 Other problems related to housing and economic circumstances: Secondary | ICD-10-CM | POA: Diagnosis not present

## 2016-12-21 DIAGNOSIS — F419 Anxiety disorder, unspecified: Secondary | ICD-10-CM | POA: Diagnosis not present

## 2016-12-27 DIAGNOSIS — R269 Unspecified abnormalities of gait and mobility: Secondary | ICD-10-CM | POA: Diagnosis not present

## 2016-12-27 DIAGNOSIS — Z9181 History of falling: Secondary | ICD-10-CM | POA: Diagnosis not present

## 2016-12-27 DIAGNOSIS — F419 Anxiety disorder, unspecified: Secondary | ICD-10-CM | POA: Diagnosis not present

## 2016-12-27 DIAGNOSIS — C73 Malignant neoplasm of thyroid gland: Secondary | ICD-10-CM | POA: Diagnosis not present

## 2016-12-27 DIAGNOSIS — I2510 Atherosclerotic heart disease of native coronary artery without angina pectoris: Secondary | ICD-10-CM | POA: Diagnosis not present

## 2016-12-27 DIAGNOSIS — N189 Chronic kidney disease, unspecified: Secondary | ICD-10-CM | POA: Diagnosis not present

## 2016-12-27 DIAGNOSIS — Z6830 Body mass index (BMI) 30.0-30.9, adult: Secondary | ICD-10-CM | POA: Diagnosis not present

## 2016-12-29 DIAGNOSIS — Z9181 History of falling: Secondary | ICD-10-CM | POA: Diagnosis not present

## 2016-12-29 DIAGNOSIS — R269 Unspecified abnormalities of gait and mobility: Secondary | ICD-10-CM | POA: Diagnosis not present

## 2017-01-05 DIAGNOSIS — Z9181 History of falling: Secondary | ICD-10-CM | POA: Diagnosis not present

## 2017-01-05 DIAGNOSIS — R269 Unspecified abnormalities of gait and mobility: Secondary | ICD-10-CM | POA: Diagnosis not present

## 2017-01-10 DIAGNOSIS — R269 Unspecified abnormalities of gait and mobility: Secondary | ICD-10-CM | POA: Diagnosis not present

## 2017-01-10 DIAGNOSIS — Z9181 History of falling: Secondary | ICD-10-CM | POA: Diagnosis not present

## 2017-01-12 DIAGNOSIS — M19079 Primary osteoarthritis, unspecified ankle and foot: Secondary | ICD-10-CM | POA: Diagnosis not present

## 2017-01-12 DIAGNOSIS — E1165 Type 2 diabetes mellitus with hyperglycemia: Secondary | ICD-10-CM | POA: Diagnosis not present

## 2017-01-12 DIAGNOSIS — F419 Anxiety disorder, unspecified: Secondary | ICD-10-CM | POA: Diagnosis not present

## 2017-01-12 DIAGNOSIS — B1920 Unspecified viral hepatitis C without hepatic coma: Secondary | ICD-10-CM | POA: Diagnosis not present

## 2017-01-12 DIAGNOSIS — K219 Gastro-esophageal reflux disease without esophagitis: Secondary | ICD-10-CM | POA: Diagnosis not present

## 2017-01-12 DIAGNOSIS — Z743 Need for continuous supervision: Secondary | ICD-10-CM | POA: Diagnosis not present

## 2017-01-12 DIAGNOSIS — Z8673 Personal history of transient ischemic attack (TIA), and cerebral infarction without residual deficits: Secondary | ICD-10-CM | POA: Diagnosis not present

## 2017-01-12 DIAGNOSIS — R569 Unspecified convulsions: Secondary | ICD-10-CM | POA: Diagnosis not present

## 2017-01-12 DIAGNOSIS — R4182 Altered mental status, unspecified: Secondary | ICD-10-CM | POA: Diagnosis not present

## 2017-01-12 DIAGNOSIS — I509 Heart failure, unspecified: Secondary | ICD-10-CM | POA: Diagnosis not present

## 2017-01-12 DIAGNOSIS — Z9049 Acquired absence of other specified parts of digestive tract: Secondary | ICD-10-CM | POA: Diagnosis not present

## 2017-01-12 DIAGNOSIS — E785 Hyperlipidemia, unspecified: Secondary | ICD-10-CM | POA: Diagnosis not present

## 2017-01-12 DIAGNOSIS — Z9181 History of falling: Secondary | ICD-10-CM | POA: Diagnosis not present

## 2017-01-12 DIAGNOSIS — F0390 Unspecified dementia without behavioral disturbance: Secondary | ICD-10-CM | POA: Diagnosis not present

## 2017-01-12 DIAGNOSIS — Z90710 Acquired absence of both cervix and uterus: Secondary | ICD-10-CM | POA: Diagnosis not present

## 2017-01-12 DIAGNOSIS — Z7982 Long term (current) use of aspirin: Secondary | ICD-10-CM | POA: Diagnosis not present

## 2017-01-12 DIAGNOSIS — R269 Unspecified abnormalities of gait and mobility: Secondary | ICD-10-CM | POA: Diagnosis not present

## 2017-01-12 DIAGNOSIS — I110 Hypertensive heart disease with heart failure: Secondary | ICD-10-CM | POA: Diagnosis not present

## 2017-01-12 DIAGNOSIS — F445 Conversion disorder with seizures or convulsions: Secondary | ICD-10-CM | POA: Diagnosis not present

## 2017-01-12 DIAGNOSIS — R531 Weakness: Secondary | ICD-10-CM | POA: Diagnosis not present

## 2017-01-12 DIAGNOSIS — Z794 Long term (current) use of insulin: Secondary | ICD-10-CM | POA: Diagnosis not present

## 2017-01-12 DIAGNOSIS — E079 Disorder of thyroid, unspecified: Secondary | ICD-10-CM | POA: Diagnosis not present

## 2017-01-12 DIAGNOSIS — Z885 Allergy status to narcotic agent status: Secondary | ICD-10-CM | POA: Diagnosis not present

## 2017-01-19 DIAGNOSIS — Z598 Other problems related to housing and economic circumstances: Secondary | ICD-10-CM | POA: Diagnosis not present

## 2017-01-19 DIAGNOSIS — F419 Anxiety disorder, unspecified: Secondary | ICD-10-CM | POA: Diagnosis not present

## 2017-01-19 DIAGNOSIS — R419 Unspecified symptoms and signs involving cognitive functions and awareness: Secondary | ICD-10-CM | POA: Diagnosis not present

## 2017-01-19 DIAGNOSIS — F331 Major depressive disorder, recurrent, moderate: Secondary | ICD-10-CM | POA: Diagnosis not present

## 2017-01-24 DIAGNOSIS — E210 Primary hyperparathyroidism: Secondary | ICD-10-CM | POA: Diagnosis not present

## 2017-01-24 DIAGNOSIS — Z23 Encounter for immunization: Secondary | ICD-10-CM | POA: Diagnosis not present

## 2017-01-24 DIAGNOSIS — Z6829 Body mass index (BMI) 29.0-29.9, adult: Secondary | ICD-10-CM | POA: Diagnosis not present

## 2017-01-24 DIAGNOSIS — I2510 Atherosclerotic heart disease of native coronary artery without angina pectoris: Secondary | ICD-10-CM | POA: Diagnosis not present

## 2017-01-24 DIAGNOSIS — E7849 Other hyperlipidemia: Secondary | ICD-10-CM | POA: Diagnosis not present

## 2017-02-01 DIAGNOSIS — N183 Chronic kidney disease, stage 3 (moderate): Secondary | ICD-10-CM | POA: Diagnosis not present

## 2017-02-02 DIAGNOSIS — Z794 Long term (current) use of insulin: Secondary | ICD-10-CM | POA: Diagnosis not present

## 2017-02-02 DIAGNOSIS — Z90710 Acquired absence of both cervix and uterus: Secondary | ICD-10-CM | POA: Diagnosis not present

## 2017-02-02 DIAGNOSIS — F329 Major depressive disorder, single episode, unspecified: Secondary | ICD-10-CM | POA: Diagnosis not present

## 2017-02-02 DIAGNOSIS — Z8673 Personal history of transient ischemic attack (TIA), and cerebral infarction without residual deficits: Secondary | ICD-10-CM | POA: Diagnosis not present

## 2017-02-02 DIAGNOSIS — E079 Disorder of thyroid, unspecified: Secondary | ICD-10-CM | POA: Diagnosis not present

## 2017-02-02 DIAGNOSIS — E119 Type 2 diabetes mellitus without complications: Secondary | ICD-10-CM | POA: Diagnosis not present

## 2017-02-02 DIAGNOSIS — F419 Anxiety disorder, unspecified: Secondary | ICD-10-CM | POA: Diagnosis not present

## 2017-02-02 DIAGNOSIS — Z7984 Long term (current) use of oral hypoglycemic drugs: Secondary | ICD-10-CM | POA: Diagnosis not present

## 2017-02-02 DIAGNOSIS — Z880 Allergy status to penicillin: Secondary | ICD-10-CM | POA: Diagnosis not present

## 2017-02-02 DIAGNOSIS — Z9049 Acquired absence of other specified parts of digestive tract: Secondary | ICD-10-CM | POA: Diagnosis not present

## 2017-02-02 DIAGNOSIS — I509 Heart failure, unspecified: Secondary | ICD-10-CM | POA: Diagnosis not present

## 2017-02-02 DIAGNOSIS — Z91018 Allergy to other foods: Secondary | ICD-10-CM | POA: Diagnosis not present

## 2017-02-02 DIAGNOSIS — F322 Major depressive disorder, single episode, severe without psychotic features: Secondary | ICD-10-CM | POA: Diagnosis not present

## 2017-02-02 DIAGNOSIS — E785 Hyperlipidemia, unspecified: Secondary | ICD-10-CM | POA: Diagnosis not present

## 2017-02-02 DIAGNOSIS — I2510 Atherosclerotic heart disease of native coronary artery without angina pectoris: Secondary | ICD-10-CM | POA: Diagnosis not present

## 2017-02-02 DIAGNOSIS — R45851 Suicidal ideations: Secondary | ICD-10-CM | POA: Diagnosis not present

## 2017-02-02 DIAGNOSIS — K219 Gastro-esophageal reflux disease without esophagitis: Secondary | ICD-10-CM | POA: Diagnosis not present

## 2017-02-02 DIAGNOSIS — F0390 Unspecified dementia without behavioral disturbance: Secondary | ICD-10-CM | POA: Diagnosis not present

## 2017-02-02 DIAGNOSIS — B1920 Unspecified viral hepatitis C without hepatic coma: Secondary | ICD-10-CM | POA: Diagnosis not present

## 2017-02-02 DIAGNOSIS — I110 Hypertensive heart disease with heart failure: Secondary | ICD-10-CM | POA: Diagnosis not present

## 2017-02-14 DIAGNOSIS — F419 Anxiety disorder, unspecified: Secondary | ICD-10-CM | POA: Diagnosis not present

## 2017-02-14 DIAGNOSIS — Z598 Other problems related to housing and economic circumstances: Secondary | ICD-10-CM | POA: Diagnosis not present

## 2017-02-14 DIAGNOSIS — F331 Major depressive disorder, recurrent, moderate: Secondary | ICD-10-CM | POA: Diagnosis not present

## 2017-02-14 DIAGNOSIS — R419 Unspecified symptoms and signs involving cognitive functions and awareness: Secondary | ICD-10-CM | POA: Diagnosis not present

## 2017-02-15 DIAGNOSIS — N183 Chronic kidney disease, stage 3 (moderate): Secondary | ICD-10-CM | POA: Diagnosis not present

## 2017-02-15 DIAGNOSIS — I129 Hypertensive chronic kidney disease with stage 1 through stage 4 chronic kidney disease, or unspecified chronic kidney disease: Secondary | ICD-10-CM | POA: Diagnosis not present

## 2017-02-15 DIAGNOSIS — E782 Mixed hyperlipidemia: Secondary | ICD-10-CM | POA: Diagnosis not present

## 2017-02-15 DIAGNOSIS — E1151 Type 2 diabetes mellitus with diabetic peripheral angiopathy without gangrene: Secondary | ICD-10-CM | POA: Diagnosis not present

## 2017-02-15 DIAGNOSIS — E1122 Type 2 diabetes mellitus with diabetic chronic kidney disease: Secondary | ICD-10-CM | POA: Diagnosis not present

## 2017-02-15 DIAGNOSIS — R600 Localized edema: Secondary | ICD-10-CM | POA: Diagnosis not present

## 2017-02-15 DIAGNOSIS — I739 Peripheral vascular disease, unspecified: Secondary | ICD-10-CM | POA: Diagnosis not present

## 2017-02-15 DIAGNOSIS — B351 Tinea unguium: Secondary | ICD-10-CM | POA: Diagnosis not present

## 2017-03-07 ENCOUNTER — Ambulatory Visit: Payer: Self-pay | Admitting: Family Medicine

## 2017-03-10 DIAGNOSIS — I2510 Atherosclerotic heart disease of native coronary artery without angina pectoris: Secondary | ICD-10-CM | POA: Diagnosis not present

## 2017-03-10 DIAGNOSIS — E1165 Type 2 diabetes mellitus with hyperglycemia: Secondary | ICD-10-CM | POA: Diagnosis not present

## 2017-03-10 DIAGNOSIS — F419 Anxiety disorder, unspecified: Secondary | ICD-10-CM | POA: Diagnosis not present

## 2017-03-10 DIAGNOSIS — R0789 Other chest pain: Secondary | ICD-10-CM | POA: Diagnosis not present

## 2017-03-10 DIAGNOSIS — Z87891 Personal history of nicotine dependence: Secondary | ICD-10-CM | POA: Diagnosis not present

## 2017-03-10 DIAGNOSIS — Z79899 Other long term (current) drug therapy: Secondary | ICD-10-CM | POA: Diagnosis not present

## 2017-03-10 DIAGNOSIS — Z955 Presence of coronary angioplasty implant and graft: Secondary | ICD-10-CM | POA: Diagnosis not present

## 2017-03-10 DIAGNOSIS — E039 Hypothyroidism, unspecified: Secondary | ICD-10-CM | POA: Diagnosis not present

## 2017-03-10 DIAGNOSIS — G8929 Other chronic pain: Secondary | ICD-10-CM | POA: Diagnosis not present

## 2017-03-10 DIAGNOSIS — R079 Chest pain, unspecified: Secondary | ICD-10-CM | POA: Diagnosis not present

## 2017-03-10 DIAGNOSIS — R0602 Shortness of breath: Secondary | ICD-10-CM | POA: Diagnosis not present

## 2017-03-10 DIAGNOSIS — N289 Disorder of kidney and ureter, unspecified: Secondary | ICD-10-CM | POA: Diagnosis not present

## 2017-03-10 DIAGNOSIS — I10 Essential (primary) hypertension: Secondary | ICD-10-CM | POA: Diagnosis not present

## 2017-03-10 DIAGNOSIS — E785 Hyperlipidemia, unspecified: Secondary | ICD-10-CM | POA: Diagnosis not present

## 2017-03-10 DIAGNOSIS — Z8679 Personal history of other diseases of the circulatory system: Secondary | ICD-10-CM | POA: Diagnosis not present

## 2017-03-10 DIAGNOSIS — Z794 Long term (current) use of insulin: Secondary | ICD-10-CM | POA: Diagnosis not present

## 2017-03-11 DIAGNOSIS — Z794 Long term (current) use of insulin: Secondary | ICD-10-CM

## 2017-03-11 DIAGNOSIS — R0789 Other chest pain: Secondary | ICD-10-CM | POA: Diagnosis not present

## 2017-03-11 DIAGNOSIS — E039 Hypothyroidism, unspecified: Secondary | ICD-10-CM | POA: Diagnosis not present

## 2017-03-11 DIAGNOSIS — Z79899 Other long term (current) drug therapy: Secondary | ICD-10-CM | POA: Diagnosis not present

## 2017-03-11 DIAGNOSIS — F419 Anxiety disorder, unspecified: Secondary | ICD-10-CM | POA: Insufficient documentation

## 2017-03-11 DIAGNOSIS — E1165 Type 2 diabetes mellitus with hyperglycemia: Secondary | ICD-10-CM

## 2017-03-11 DIAGNOSIS — I10 Essential (primary) hypertension: Secondary | ICD-10-CM | POA: Diagnosis not present

## 2017-03-11 DIAGNOSIS — I2510 Atherosclerotic heart disease of native coronary artery without angina pectoris: Secondary | ICD-10-CM | POA: Diagnosis not present

## 2017-03-11 DIAGNOSIS — E1159 Type 2 diabetes mellitus with other circulatory complications: Secondary | ICD-10-CM | POA: Diagnosis not present

## 2017-03-11 DIAGNOSIS — Z955 Presence of coronary angioplasty implant and graft: Secondary | ICD-10-CM | POA: Diagnosis not present

## 2017-03-11 DIAGNOSIS — E785 Hyperlipidemia, unspecified: Secondary | ICD-10-CM | POA: Diagnosis not present

## 2017-03-11 DIAGNOSIS — N289 Disorder of kidney and ureter, unspecified: Secondary | ICD-10-CM | POA: Diagnosis not present

## 2017-03-11 DIAGNOSIS — G8929 Other chronic pain: Secondary | ICD-10-CM | POA: Diagnosis not present

## 2017-03-12 DIAGNOSIS — R079 Chest pain, unspecified: Secondary | ICD-10-CM | POA: Diagnosis not present

## 2017-03-26 DIAGNOSIS — Z7902 Long term (current) use of antithrombotics/antiplatelets: Secondary | ICD-10-CM | POA: Diagnosis not present

## 2017-03-26 DIAGNOSIS — R112 Nausea with vomiting, unspecified: Secondary | ICD-10-CM | POA: Diagnosis not present

## 2017-03-26 DIAGNOSIS — I69351 Hemiplegia and hemiparesis following cerebral infarction affecting right dominant side: Secondary | ICD-10-CM | POA: Diagnosis not present

## 2017-03-26 DIAGNOSIS — R109 Unspecified abdominal pain: Secondary | ICD-10-CM | POA: Diagnosis not present

## 2017-03-26 DIAGNOSIS — E1165 Type 2 diabetes mellitus with hyperglycemia: Secondary | ICD-10-CM | POA: Diagnosis present

## 2017-03-26 DIAGNOSIS — Z955 Presence of coronary angioplasty implant and graft: Secondary | ICD-10-CM | POA: Diagnosis not present

## 2017-03-26 DIAGNOSIS — R638 Other symptoms and signs concerning food and fluid intake: Secondary | ICD-10-CM | POA: Diagnosis not present

## 2017-03-26 DIAGNOSIS — F419 Anxiety disorder, unspecified: Secondary | ICD-10-CM | POA: Diagnosis present

## 2017-03-26 DIAGNOSIS — R1110 Vomiting, unspecified: Secondary | ICD-10-CM | POA: Diagnosis not present

## 2017-03-26 DIAGNOSIS — K529 Noninfective gastroenteritis and colitis, unspecified: Secondary | ICD-10-CM | POA: Diagnosis present

## 2017-03-26 DIAGNOSIS — Z794 Long term (current) use of insulin: Secondary | ICD-10-CM | POA: Diagnosis not present

## 2017-03-26 DIAGNOSIS — I252 Old myocardial infarction: Secondary | ICD-10-CM | POA: Diagnosis not present

## 2017-03-26 DIAGNOSIS — R1084 Generalized abdominal pain: Secondary | ICD-10-CM | POA: Diagnosis not present

## 2017-03-26 DIAGNOSIS — E860 Dehydration: Secondary | ICD-10-CM | POA: Diagnosis present

## 2017-03-26 DIAGNOSIS — G40909 Epilepsy, unspecified, not intractable, without status epilepticus: Secondary | ICD-10-CM | POA: Diagnosis present

## 2017-03-26 DIAGNOSIS — E1159 Type 2 diabetes mellitus with other circulatory complications: Secondary | ICD-10-CM | POA: Diagnosis not present

## 2017-03-26 DIAGNOSIS — I2510 Atherosclerotic heart disease of native coronary artery without angina pectoris: Secondary | ICD-10-CM | POA: Diagnosis present

## 2017-03-26 DIAGNOSIS — I10 Essential (primary) hypertension: Secondary | ICD-10-CM | POA: Diagnosis not present

## 2017-03-26 DIAGNOSIS — Z87891 Personal history of nicotine dependence: Secondary | ICD-10-CM | POA: Diagnosis not present

## 2017-03-26 DIAGNOSIS — R51 Headache: Secondary | ICD-10-CM | POA: Diagnosis not present

## 2017-03-26 DIAGNOSIS — S0990XA Unspecified injury of head, initial encounter: Secondary | ICD-10-CM | POA: Diagnosis not present

## 2017-03-26 DIAGNOSIS — R569 Unspecified convulsions: Secondary | ICD-10-CM | POA: Diagnosis not present

## 2017-03-26 DIAGNOSIS — E039 Hypothyroidism, unspecified: Secondary | ICD-10-CM | POA: Diagnosis present

## 2017-03-26 DIAGNOSIS — S199XXA Unspecified injury of neck, initial encounter: Secondary | ICD-10-CM | POA: Diagnosis not present

## 2017-03-26 DIAGNOSIS — Z79899 Other long term (current) drug therapy: Secondary | ICD-10-CM | POA: Diagnosis not present

## 2017-03-26 DIAGNOSIS — Z5321 Procedure and treatment not carried out due to patient leaving prior to being seen by health care provider: Secondary | ICD-10-CM | POA: Diagnosis not present

## 2017-03-26 DIAGNOSIS — R197 Diarrhea, unspecified: Secondary | ICD-10-CM | POA: Diagnosis not present

## 2017-03-27 DIAGNOSIS — R10814 Left lower quadrant abdominal tenderness: Secondary | ICD-10-CM | POA: Diagnosis not present

## 2017-03-27 DIAGNOSIS — S199XXA Unspecified injury of neck, initial encounter: Secondary | ICD-10-CM | POA: Diagnosis not present

## 2017-03-27 DIAGNOSIS — G40909 Epilepsy, unspecified, not intractable, without status epilepticus: Secondary | ICD-10-CM | POA: Diagnosis not present

## 2017-03-27 DIAGNOSIS — R1084 Generalized abdominal pain: Secondary | ICD-10-CM | POA: Diagnosis not present

## 2017-03-27 DIAGNOSIS — R1110 Vomiting, unspecified: Secondary | ICD-10-CM | POA: Diagnosis not present

## 2017-03-27 DIAGNOSIS — Z9049 Acquired absence of other specified parts of digestive tract: Secondary | ICD-10-CM | POA: Diagnosis not present

## 2017-03-27 DIAGNOSIS — R10813 Right lower quadrant abdominal tenderness: Secondary | ICD-10-CM | POA: Diagnosis not present

## 2017-03-27 DIAGNOSIS — I10 Essential (primary) hypertension: Secondary | ICD-10-CM | POA: Diagnosis not present

## 2017-03-27 DIAGNOSIS — R197 Diarrhea, unspecified: Secondary | ICD-10-CM | POA: Diagnosis not present

## 2017-03-27 DIAGNOSIS — R638 Other symptoms and signs concerning food and fluid intake: Secondary | ICD-10-CM | POA: Diagnosis not present

## 2017-03-27 DIAGNOSIS — Z955 Presence of coronary angioplasty implant and graft: Secondary | ICD-10-CM | POA: Diagnosis not present

## 2017-03-27 DIAGNOSIS — R112 Nausea with vomiting, unspecified: Secondary | ICD-10-CM | POA: Diagnosis not present

## 2017-03-27 DIAGNOSIS — R569 Unspecified convulsions: Secondary | ICD-10-CM | POA: Diagnosis not present

## 2017-03-27 DIAGNOSIS — I252 Old myocardial infarction: Secondary | ICD-10-CM | POA: Diagnosis not present

## 2017-03-27 DIAGNOSIS — E860 Dehydration: Secondary | ICD-10-CM | POA: Diagnosis not present

## 2017-03-27 DIAGNOSIS — S0990XA Unspecified injury of head, initial encounter: Secondary | ICD-10-CM | POA: Diagnosis not present

## 2017-03-27 DIAGNOSIS — R109 Unspecified abdominal pain: Secondary | ICD-10-CM | POA: Diagnosis not present

## 2017-03-27 DIAGNOSIS — E1065 Type 1 diabetes mellitus with hyperglycemia: Secondary | ICD-10-CM | POA: Diagnosis not present

## 2017-03-27 DIAGNOSIS — R51 Headache: Secondary | ICD-10-CM | POA: Diagnosis not present

## 2017-04-13 DIAGNOSIS — I2510 Atherosclerotic heart disease of native coronary artery without angina pectoris: Secondary | ICD-10-CM | POA: Diagnosis not present

## 2017-04-13 DIAGNOSIS — E119 Type 2 diabetes mellitus without complications: Secondary | ICD-10-CM | POA: Diagnosis not present

## 2017-04-13 DIAGNOSIS — I10 Essential (primary) hypertension: Secondary | ICD-10-CM | POA: Diagnosis not present

## 2017-04-13 DIAGNOSIS — I679 Cerebrovascular disease, unspecified: Secondary | ICD-10-CM | POA: Diagnosis not present

## 2017-05-08 DIAGNOSIS — I679 Cerebrovascular disease, unspecified: Secondary | ICD-10-CM | POA: Diagnosis not present

## 2017-05-08 DIAGNOSIS — E119 Type 2 diabetes mellitus without complications: Secondary | ICD-10-CM | POA: Diagnosis not present

## 2017-05-08 DIAGNOSIS — I10 Essential (primary) hypertension: Secondary | ICD-10-CM | POA: Diagnosis not present

## 2017-05-08 DIAGNOSIS — I2510 Atherosclerotic heart disease of native coronary artery without angina pectoris: Secondary | ICD-10-CM | POA: Diagnosis not present

## 2017-05-22 DIAGNOSIS — I10 Essential (primary) hypertension: Secondary | ICD-10-CM | POA: Diagnosis not present

## 2017-05-22 DIAGNOSIS — I2510 Atherosclerotic heart disease of native coronary artery without angina pectoris: Secondary | ICD-10-CM | POA: Diagnosis not present

## 2017-05-22 DIAGNOSIS — E119 Type 2 diabetes mellitus without complications: Secondary | ICD-10-CM | POA: Diagnosis not present

## 2017-05-22 DIAGNOSIS — I679 Cerebrovascular disease, unspecified: Secondary | ICD-10-CM | POA: Diagnosis not present

## 2017-05-31 DIAGNOSIS — I2510 Atherosclerotic heart disease of native coronary artery without angina pectoris: Secondary | ICD-10-CM | POA: Diagnosis not present

## 2017-05-31 DIAGNOSIS — I679 Cerebrovascular disease, unspecified: Secondary | ICD-10-CM | POA: Diagnosis not present

## 2017-05-31 DIAGNOSIS — G20 Parkinson's disease: Secondary | ICD-10-CM | POA: Diagnosis not present

## 2017-05-31 DIAGNOSIS — E782 Mixed hyperlipidemia: Secondary | ICD-10-CM | POA: Diagnosis not present

## 2017-06-21 DIAGNOSIS — I679 Cerebrovascular disease, unspecified: Secondary | ICD-10-CM | POA: Diagnosis not present

## 2017-06-21 DIAGNOSIS — E119 Type 2 diabetes mellitus without complications: Secondary | ICD-10-CM | POA: Diagnosis not present

## 2017-06-21 DIAGNOSIS — I10 Essential (primary) hypertension: Secondary | ICD-10-CM | POA: Diagnosis not present

## 2017-06-21 DIAGNOSIS — I2510 Atherosclerotic heart disease of native coronary artery without angina pectoris: Secondary | ICD-10-CM | POA: Diagnosis not present

## 2017-08-23 DIAGNOSIS — Z01118 Encounter for examination of ears and hearing with other abnormal findings: Secondary | ICD-10-CM | POA: Diagnosis not present

## 2017-08-23 DIAGNOSIS — E039 Hypothyroidism, unspecified: Secondary | ICD-10-CM | POA: Diagnosis not present

## 2017-08-23 DIAGNOSIS — E1165 Type 2 diabetes mellitus with hyperglycemia: Secondary | ICD-10-CM | POA: Diagnosis not present

## 2017-08-23 DIAGNOSIS — G4700 Insomnia, unspecified: Secondary | ICD-10-CM | POA: Diagnosis not present

## 2017-08-23 DIAGNOSIS — E785 Hyperlipidemia, unspecified: Secondary | ICD-10-CM | POA: Diagnosis not present

## 2017-08-23 DIAGNOSIS — H538 Other visual disturbances: Secondary | ICD-10-CM | POA: Diagnosis not present

## 2017-08-23 DIAGNOSIS — I10 Essential (primary) hypertension: Secondary | ICD-10-CM | POA: Diagnosis not present

## 2017-08-23 DIAGNOSIS — Z8673 Personal history of transient ischemic attack (TIA), and cerebral infarction without residual deficits: Secondary | ICD-10-CM | POA: Diagnosis not present

## 2017-08-23 DIAGNOSIS — Z5181 Encounter for therapeutic drug level monitoring: Secondary | ICD-10-CM | POA: Diagnosis not present

## 2017-08-23 DIAGNOSIS — I119 Hypertensive heart disease without heart failure: Secondary | ICD-10-CM | POA: Diagnosis not present

## 2017-08-28 DIAGNOSIS — R51 Headache: Secondary | ICD-10-CM | POA: Diagnosis not present

## 2017-08-28 DIAGNOSIS — R569 Unspecified convulsions: Secondary | ICD-10-CM | POA: Diagnosis not present

## 2017-08-28 DIAGNOSIS — Z8673 Personal history of transient ischemic attack (TIA), and cerebral infarction without residual deficits: Secondary | ICD-10-CM | POA: Diagnosis not present

## 2017-08-28 DIAGNOSIS — I6782 Cerebral ischemia: Secondary | ICD-10-CM | POA: Diagnosis not present

## 2017-08-28 DIAGNOSIS — Y998 Other external cause status: Secondary | ICD-10-CM | POA: Diagnosis not present

## 2017-08-28 DIAGNOSIS — W19XXXA Unspecified fall, initial encounter: Secondary | ICD-10-CM | POA: Diagnosis not present

## 2017-08-28 DIAGNOSIS — G319 Degenerative disease of nervous system, unspecified: Secondary | ICD-10-CM | POA: Diagnosis not present

## 2017-09-06 DIAGNOSIS — I119 Hypertensive heart disease without heart failure: Secondary | ICD-10-CM | POA: Diagnosis not present

## 2017-09-06 DIAGNOSIS — Z8673 Personal history of transient ischemic attack (TIA), and cerebral infarction without residual deficits: Secondary | ICD-10-CM | POA: Diagnosis not present

## 2017-09-06 DIAGNOSIS — R740 Nonspecific elevation of levels of transaminase and lactic acid dehydrogenase [LDH]: Secondary | ICD-10-CM | POA: Diagnosis not present

## 2017-09-06 DIAGNOSIS — E039 Hypothyroidism, unspecified: Secondary | ICD-10-CM | POA: Diagnosis not present

## 2017-09-06 DIAGNOSIS — G4700 Insomnia, unspecified: Secondary | ICD-10-CM | POA: Diagnosis not present

## 2017-09-06 DIAGNOSIS — E785 Hyperlipidemia, unspecified: Secondary | ICD-10-CM | POA: Diagnosis not present

## 2017-09-06 DIAGNOSIS — N289 Disorder of kidney and ureter, unspecified: Secondary | ICD-10-CM | POA: Diagnosis not present

## 2017-09-06 DIAGNOSIS — E1165 Type 2 diabetes mellitus with hyperglycemia: Secondary | ICD-10-CM | POA: Diagnosis not present

## 2017-09-06 DIAGNOSIS — I10 Essential (primary) hypertension: Secondary | ICD-10-CM | POA: Diagnosis not present

## 2017-09-07 DIAGNOSIS — G20 Parkinson's disease: Secondary | ICD-10-CM | POA: Diagnosis not present

## 2017-09-07 DIAGNOSIS — I2510 Atherosclerotic heart disease of native coronary artery without angina pectoris: Secondary | ICD-10-CM | POA: Diagnosis not present

## 2017-09-07 DIAGNOSIS — I679 Cerebrovascular disease, unspecified: Secondary | ICD-10-CM | POA: Diagnosis not present

## 2017-09-07 DIAGNOSIS — E782 Mixed hyperlipidemia: Secondary | ICD-10-CM | POA: Diagnosis not present

## 2017-09-12 DIAGNOSIS — G20 Parkinson's disease: Secondary | ICD-10-CM | POA: Diagnosis not present

## 2017-09-12 DIAGNOSIS — I639 Cerebral infarction, unspecified: Secondary | ICD-10-CM | POA: Diagnosis not present

## 2017-09-12 DIAGNOSIS — F0390 Unspecified dementia without behavioral disturbance: Secondary | ICD-10-CM | POA: Diagnosis not present

## 2017-09-20 ENCOUNTER — Ambulatory Visit: Payer: Self-pay | Admitting: Registered"

## 2017-10-18 DIAGNOSIS — M25571 Pain in right ankle and joints of right foot: Secondary | ICD-10-CM | POA: Diagnosis not present

## 2017-10-19 DIAGNOSIS — W010XXA Fall on same level from slipping, tripping and stumbling without subsequent striking against object, initial encounter: Secondary | ICD-10-CM | POA: Diagnosis not present

## 2017-10-19 DIAGNOSIS — S99921A Unspecified injury of right foot, initial encounter: Secondary | ICD-10-CM | POA: Diagnosis not present

## 2017-10-19 DIAGNOSIS — S93401A Sprain of unspecified ligament of right ankle, initial encounter: Secondary | ICD-10-CM | POA: Diagnosis not present

## 2017-10-19 DIAGNOSIS — M25571 Pain in right ankle and joints of right foot: Secondary | ICD-10-CM | POA: Diagnosis not present

## 2017-10-19 DIAGNOSIS — Y999 Unspecified external cause status: Secondary | ICD-10-CM | POA: Diagnosis not present

## 2017-10-22 DIAGNOSIS — F0280 Dementia in other diseases classified elsewhere without behavioral disturbance: Secondary | ICD-10-CM | POA: Diagnosis not present

## 2017-10-22 DIAGNOSIS — R740 Nonspecific elevation of levels of transaminase and lactic acid dehydrogenase [LDH]: Secondary | ICD-10-CM | POA: Diagnosis not present

## 2017-10-22 DIAGNOSIS — E785 Hyperlipidemia, unspecified: Secondary | ICD-10-CM | POA: Diagnosis not present

## 2017-10-22 DIAGNOSIS — E039 Hypothyroidism, unspecified: Secondary | ICD-10-CM | POA: Diagnosis not present

## 2017-10-22 DIAGNOSIS — N289 Disorder of kidney and ureter, unspecified: Secondary | ICD-10-CM | POA: Diagnosis not present

## 2017-10-22 DIAGNOSIS — Z8673 Personal history of transient ischemic attack (TIA), and cerebral infarction without residual deficits: Secondary | ICD-10-CM | POA: Diagnosis not present

## 2017-10-22 DIAGNOSIS — I10 Essential (primary) hypertension: Secondary | ICD-10-CM | POA: Diagnosis not present

## 2017-10-22 DIAGNOSIS — I119 Hypertensive heart disease without heart failure: Secondary | ICD-10-CM | POA: Diagnosis not present

## 2017-10-22 DIAGNOSIS — G4700 Insomnia, unspecified: Secondary | ICD-10-CM | POA: Diagnosis not present

## 2017-10-22 DIAGNOSIS — L989 Disorder of the skin and subcutaneous tissue, unspecified: Secondary | ICD-10-CM | POA: Diagnosis not present

## 2017-10-22 DIAGNOSIS — G8191 Hemiplegia, unspecified affecting right dominant side: Secondary | ICD-10-CM | POA: Diagnosis not present

## 2017-10-22 DIAGNOSIS — E1165 Type 2 diabetes mellitus with hyperglycemia: Secondary | ICD-10-CM | POA: Diagnosis not present

## 2017-11-12 DIAGNOSIS — R21 Rash and other nonspecific skin eruption: Secondary | ICD-10-CM | POA: Diagnosis not present

## 2017-11-12 DIAGNOSIS — R531 Weakness: Secondary | ICD-10-CM | POA: Diagnosis not present

## 2017-11-12 DIAGNOSIS — E1165 Type 2 diabetes mellitus with hyperglycemia: Secondary | ICD-10-CM | POA: Diagnosis not present

## 2017-11-12 DIAGNOSIS — R509 Fever, unspecified: Secondary | ICD-10-CM | POA: Diagnosis not present

## 2017-11-12 DIAGNOSIS — R079 Chest pain, unspecified: Secondary | ICD-10-CM | POA: Diagnosis not present

## 2017-11-12 DIAGNOSIS — R404 Transient alteration of awareness: Secondary | ICD-10-CM | POA: Diagnosis not present

## 2017-11-12 DIAGNOSIS — R05 Cough: Secondary | ICD-10-CM | POA: Diagnosis not present

## 2017-11-13 DIAGNOSIS — R079 Chest pain, unspecified: Secondary | ICD-10-CM | POA: Diagnosis not present

## 2017-11-17 DIAGNOSIS — R509 Fever, unspecified: Secondary | ICD-10-CM | POA: Diagnosis not present

## 2017-11-17 DIAGNOSIS — I959 Hypotension, unspecified: Secondary | ICD-10-CM | POA: Diagnosis not present

## 2017-11-17 DIAGNOSIS — I2510 Atherosclerotic heart disease of native coronary artery without angina pectoris: Secondary | ICD-10-CM | POA: Diagnosis not present

## 2017-11-17 DIAGNOSIS — J188 Other pneumonia, unspecified organism: Secondary | ICD-10-CM | POA: Diagnosis not present

## 2017-11-17 DIAGNOSIS — R935 Abnormal findings on diagnostic imaging of other abdominal regions, including retroperitoneum: Secondary | ICD-10-CM | POA: Diagnosis not present

## 2017-11-17 DIAGNOSIS — J13 Pneumonia due to Streptococcus pneumoniae: Secondary | ICD-10-CM | POA: Diagnosis present

## 2017-11-17 DIAGNOSIS — R52 Pain, unspecified: Secondary | ICD-10-CM | POA: Diagnosis not present

## 2017-11-17 DIAGNOSIS — K635 Polyp of colon: Secondary | ICD-10-CM | POA: Diagnosis present

## 2017-11-17 DIAGNOSIS — J181 Lobar pneumonia, unspecified organism: Secondary | ICD-10-CM | POA: Diagnosis not present

## 2017-11-17 DIAGNOSIS — K6389 Other specified diseases of intestine: Secondary | ICD-10-CM | POA: Diagnosis not present

## 2017-11-17 DIAGNOSIS — R197 Diarrhea, unspecified: Secondary | ICD-10-CM | POA: Diagnosis not present

## 2017-11-17 DIAGNOSIS — R933 Abnormal findings on diagnostic imaging of other parts of digestive tract: Secondary | ICD-10-CM | POA: Diagnosis not present

## 2017-11-17 DIAGNOSIS — E039 Hypothyroidism, unspecified: Secondary | ICD-10-CM | POA: Diagnosis present

## 2017-11-17 DIAGNOSIS — M19012 Primary osteoarthritis, left shoulder: Secondary | ICD-10-CM | POA: Diagnosis not present

## 2017-11-17 DIAGNOSIS — N179 Acute kidney failure, unspecified: Secondary | ICD-10-CM | POA: Diagnosis present

## 2017-11-17 DIAGNOSIS — E1165 Type 2 diabetes mellitus with hyperglycemia: Secondary | ICD-10-CM | POA: Diagnosis not present

## 2017-11-17 DIAGNOSIS — J9811 Atelectasis: Secondary | ICD-10-CM | POA: Diagnosis not present

## 2017-11-17 DIAGNOSIS — I10 Essential (primary) hypertension: Secondary | ICD-10-CM | POA: Diagnosis not present

## 2017-11-17 DIAGNOSIS — A403 Sepsis due to Streptococcus pneumoniae: Secondary | ICD-10-CM | POA: Diagnosis not present

## 2017-11-17 DIAGNOSIS — R05 Cough: Secondary | ICD-10-CM | POA: Diagnosis not present

## 2017-11-17 DIAGNOSIS — E119 Type 2 diabetes mellitus without complications: Secondary | ICD-10-CM | POA: Diagnosis present

## 2017-11-17 DIAGNOSIS — Z87891 Personal history of nicotine dependence: Secondary | ICD-10-CM | POA: Diagnosis not present

## 2017-11-17 DIAGNOSIS — R112 Nausea with vomiting, unspecified: Secondary | ICD-10-CM | POA: Diagnosis not present

## 2017-11-17 DIAGNOSIS — Z800 Family history of malignant neoplasm of digestive organs: Secondary | ICD-10-CM | POA: Diagnosis not present

## 2017-11-17 DIAGNOSIS — K317 Polyp of stomach and duodenum: Secondary | ICD-10-CM | POA: Diagnosis not present

## 2017-11-17 DIAGNOSIS — F329 Major depressive disorder, single episode, unspecified: Secondary | ICD-10-CM | POA: Diagnosis present

## 2017-11-17 DIAGNOSIS — F419 Anxiety disorder, unspecified: Secondary | ICD-10-CM | POA: Diagnosis present

## 2017-11-17 DIAGNOSIS — D509 Iron deficiency anemia, unspecified: Secondary | ICD-10-CM | POA: Diagnosis present

## 2017-11-17 DIAGNOSIS — Z79899 Other long term (current) drug therapy: Secondary | ICD-10-CM | POA: Diagnosis not present

## 2017-11-17 DIAGNOSIS — I509 Heart failure, unspecified: Secondary | ICD-10-CM | POA: Diagnosis not present

## 2017-11-17 DIAGNOSIS — G20 Parkinson's disease: Secondary | ICD-10-CM | POA: Diagnosis present

## 2017-11-17 DIAGNOSIS — I110 Hypertensive heart disease with heart failure: Secondary | ICD-10-CM | POA: Diagnosis present

## 2017-11-17 DIAGNOSIS — A419 Sepsis, unspecified organism: Secondary | ICD-10-CM | POA: Diagnosis not present

## 2017-11-17 DIAGNOSIS — R1084 Generalized abdominal pain: Secondary | ICD-10-CM | POA: Diagnosis not present

## 2017-11-17 DIAGNOSIS — Z7902 Long term (current) use of antithrombotics/antiplatelets: Secondary | ICD-10-CM | POA: Diagnosis not present

## 2017-11-17 DIAGNOSIS — Z794 Long term (current) use of insulin: Secondary | ICD-10-CM | POA: Diagnosis not present

## 2017-11-17 DIAGNOSIS — R82998 Other abnormal findings in urine: Secondary | ICD-10-CM | POA: Diagnosis not present

## 2017-11-17 DIAGNOSIS — Z955 Presence of coronary angioplasty implant and graft: Secondary | ICD-10-CM | POA: Diagnosis not present

## 2017-11-17 DIAGNOSIS — Z79890 Hormone replacement therapy (postmenopausal): Secondary | ICD-10-CM | POA: Diagnosis not present

## 2017-11-17 DIAGNOSIS — R0603 Acute respiratory distress: Secondary | ICD-10-CM | POA: Diagnosis not present

## 2017-11-17 DIAGNOSIS — J9601 Acute respiratory failure with hypoxia: Secondary | ICD-10-CM | POA: Diagnosis not present

## 2017-11-17 DIAGNOSIS — I5032 Chronic diastolic (congestive) heart failure: Secondary | ICD-10-CM | POA: Diagnosis present

## 2017-11-17 DIAGNOSIS — G8929 Other chronic pain: Secondary | ICD-10-CM | POA: Diagnosis not present

## 2017-11-17 DIAGNOSIS — R109 Unspecified abdominal pain: Secondary | ICD-10-CM | POA: Diagnosis not present

## 2017-11-17 DIAGNOSIS — R630 Anorexia: Secondary | ICD-10-CM | POA: Diagnosis not present

## 2017-11-26 DIAGNOSIS — E119 Type 2 diabetes mellitus without complications: Secondary | ICD-10-CM | POA: Diagnosis not present

## 2017-11-26 DIAGNOSIS — I10 Essential (primary) hypertension: Secondary | ICD-10-CM | POA: Diagnosis not present

## 2017-11-26 DIAGNOSIS — Z8701 Personal history of pneumonia (recurrent): Secondary | ICD-10-CM | POA: Diagnosis not present

## 2017-11-26 DIAGNOSIS — G20 Parkinson's disease: Secondary | ICD-10-CM | POA: Diagnosis not present

## 2017-11-26 DIAGNOSIS — Z87891 Personal history of nicotine dependence: Secondary | ICD-10-CM | POA: Diagnosis not present

## 2017-11-26 DIAGNOSIS — F419 Anxiety disorder, unspecified: Secondary | ICD-10-CM | POA: Diagnosis not present

## 2017-11-26 DIAGNOSIS — F329 Major depressive disorder, single episode, unspecified: Secondary | ICD-10-CM | POA: Diagnosis not present

## 2017-11-26 DIAGNOSIS — E039 Hypothyroidism, unspecified: Secondary | ICD-10-CM | POA: Diagnosis not present

## 2017-11-26 DIAGNOSIS — Z794 Long term (current) use of insulin: Secondary | ICD-10-CM | POA: Diagnosis not present

## 2017-11-26 DIAGNOSIS — Z7902 Long term (current) use of antithrombotics/antiplatelets: Secondary | ICD-10-CM | POA: Diagnosis not present

## 2017-11-26 DIAGNOSIS — Z7982 Long term (current) use of aspirin: Secondary | ICD-10-CM | POA: Diagnosis not present

## 2017-12-03 DIAGNOSIS — I10 Essential (primary) hypertension: Secondary | ICD-10-CM | POA: Diagnosis not present

## 2017-12-03 DIAGNOSIS — G20 Parkinson's disease: Secondary | ICD-10-CM | POA: Diagnosis not present

## 2017-12-03 DIAGNOSIS — F329 Major depressive disorder, single episode, unspecified: Secondary | ICD-10-CM | POA: Diagnosis not present

## 2017-12-03 DIAGNOSIS — F419 Anxiety disorder, unspecified: Secondary | ICD-10-CM | POA: Diagnosis not present

## 2017-12-03 DIAGNOSIS — Z8701 Personal history of pneumonia (recurrent): Secondary | ICD-10-CM | POA: Diagnosis not present

## 2017-12-03 DIAGNOSIS — E119 Type 2 diabetes mellitus without complications: Secondary | ICD-10-CM | POA: Diagnosis not present

## 2017-12-04 DIAGNOSIS — Z8701 Personal history of pneumonia (recurrent): Secondary | ICD-10-CM | POA: Diagnosis not present

## 2017-12-04 DIAGNOSIS — E119 Type 2 diabetes mellitus without complications: Secondary | ICD-10-CM | POA: Diagnosis not present

## 2017-12-04 DIAGNOSIS — G20 Parkinson's disease: Secondary | ICD-10-CM | POA: Diagnosis not present

## 2017-12-04 DIAGNOSIS — F419 Anxiety disorder, unspecified: Secondary | ICD-10-CM | POA: Diagnosis not present

## 2017-12-04 DIAGNOSIS — I10 Essential (primary) hypertension: Secondary | ICD-10-CM | POA: Diagnosis not present

## 2017-12-04 DIAGNOSIS — F329 Major depressive disorder, single episode, unspecified: Secondary | ICD-10-CM | POA: Diagnosis not present

## 2017-12-05 DIAGNOSIS — Z8701 Personal history of pneumonia (recurrent): Secondary | ICD-10-CM | POA: Diagnosis not present

## 2017-12-05 DIAGNOSIS — I10 Essential (primary) hypertension: Secondary | ICD-10-CM | POA: Diagnosis not present

## 2017-12-05 DIAGNOSIS — G20 Parkinson's disease: Secondary | ICD-10-CM | POA: Diagnosis not present

## 2017-12-05 DIAGNOSIS — F329 Major depressive disorder, single episode, unspecified: Secondary | ICD-10-CM | POA: Diagnosis not present

## 2017-12-05 DIAGNOSIS — F419 Anxiety disorder, unspecified: Secondary | ICD-10-CM | POA: Diagnosis not present

## 2017-12-05 DIAGNOSIS — E119 Type 2 diabetes mellitus without complications: Secondary | ICD-10-CM | POA: Diagnosis not present

## 2017-12-07 DIAGNOSIS — I10 Essential (primary) hypertension: Secondary | ICD-10-CM | POA: Diagnosis not present

## 2017-12-07 DIAGNOSIS — F329 Major depressive disorder, single episode, unspecified: Secondary | ICD-10-CM | POA: Diagnosis not present

## 2017-12-07 DIAGNOSIS — E119 Type 2 diabetes mellitus without complications: Secondary | ICD-10-CM | POA: Diagnosis not present

## 2017-12-07 DIAGNOSIS — F419 Anxiety disorder, unspecified: Secondary | ICD-10-CM | POA: Diagnosis not present

## 2017-12-07 DIAGNOSIS — G20 Parkinson's disease: Secondary | ICD-10-CM | POA: Diagnosis not present

## 2017-12-07 DIAGNOSIS — Z8701 Personal history of pneumonia (recurrent): Secondary | ICD-10-CM | POA: Diagnosis not present

## 2017-12-09 DIAGNOSIS — I959 Hypotension, unspecified: Secondary | ICD-10-CM | POA: Diagnosis not present

## 2017-12-09 DIAGNOSIS — W19XXXA Unspecified fall, initial encounter: Secondary | ICD-10-CM | POA: Diagnosis not present

## 2017-12-09 DIAGNOSIS — R55 Syncope and collapse: Secondary | ICD-10-CM | POA: Diagnosis not present

## 2017-12-09 DIAGNOSIS — R569 Unspecified convulsions: Secondary | ICD-10-CM | POA: Diagnosis not present

## 2017-12-11 DIAGNOSIS — I493 Ventricular premature depolarization: Secondary | ICD-10-CM | POA: Diagnosis not present

## 2017-12-11 DIAGNOSIS — G20 Parkinson's disease: Secondary | ICD-10-CM | POA: Diagnosis not present

## 2017-12-11 DIAGNOSIS — F419 Anxiety disorder, unspecified: Secondary | ICD-10-CM | POA: Diagnosis not present

## 2017-12-11 DIAGNOSIS — I10 Essential (primary) hypertension: Secondary | ICD-10-CM | POA: Diagnosis not present

## 2017-12-11 DIAGNOSIS — F329 Major depressive disorder, single episode, unspecified: Secondary | ICD-10-CM | POA: Diagnosis not present

## 2017-12-11 DIAGNOSIS — E119 Type 2 diabetes mellitus without complications: Secondary | ICD-10-CM | POA: Diagnosis not present

## 2017-12-11 DIAGNOSIS — I491 Atrial premature depolarization: Secondary | ICD-10-CM | POA: Diagnosis not present

## 2017-12-11 DIAGNOSIS — Z8701 Personal history of pneumonia (recurrent): Secondary | ICD-10-CM | POA: Diagnosis not present

## 2017-12-12 DIAGNOSIS — E119 Type 2 diabetes mellitus without complications: Secondary | ICD-10-CM | POA: Diagnosis not present

## 2017-12-12 DIAGNOSIS — G20 Parkinson's disease: Secondary | ICD-10-CM | POA: Diagnosis not present

## 2017-12-12 DIAGNOSIS — F419 Anxiety disorder, unspecified: Secondary | ICD-10-CM | POA: Diagnosis not present

## 2017-12-12 DIAGNOSIS — Z8701 Personal history of pneumonia (recurrent): Secondary | ICD-10-CM | POA: Diagnosis not present

## 2017-12-12 DIAGNOSIS — I10 Essential (primary) hypertension: Secondary | ICD-10-CM | POA: Diagnosis not present

## 2017-12-12 DIAGNOSIS — F329 Major depressive disorder, single episode, unspecified: Secondary | ICD-10-CM | POA: Diagnosis not present

## 2017-12-13 DIAGNOSIS — I10 Essential (primary) hypertension: Secondary | ICD-10-CM | POA: Diagnosis not present

## 2017-12-13 DIAGNOSIS — F329 Major depressive disorder, single episode, unspecified: Secondary | ICD-10-CM | POA: Diagnosis not present

## 2017-12-13 DIAGNOSIS — Z8701 Personal history of pneumonia (recurrent): Secondary | ICD-10-CM | POA: Diagnosis not present

## 2017-12-13 DIAGNOSIS — G20 Parkinson's disease: Secondary | ICD-10-CM | POA: Diagnosis not present

## 2017-12-13 DIAGNOSIS — E119 Type 2 diabetes mellitus without complications: Secondary | ICD-10-CM | POA: Diagnosis not present

## 2017-12-13 DIAGNOSIS — F419 Anxiety disorder, unspecified: Secondary | ICD-10-CM | POA: Diagnosis not present

## 2017-12-14 DIAGNOSIS — I10 Essential (primary) hypertension: Secondary | ICD-10-CM | POA: Diagnosis not present

## 2017-12-14 DIAGNOSIS — G8191 Hemiplegia, unspecified affecting right dominant side: Secondary | ICD-10-CM | POA: Diagnosis not present

## 2017-12-14 DIAGNOSIS — G4700 Insomnia, unspecified: Secondary | ICD-10-CM | POA: Diagnosis not present

## 2017-12-14 DIAGNOSIS — E039 Hypothyroidism, unspecified: Secondary | ICD-10-CM | POA: Diagnosis not present

## 2017-12-14 DIAGNOSIS — L989 Disorder of the skin and subcutaneous tissue, unspecified: Secondary | ICD-10-CM | POA: Diagnosis not present

## 2017-12-14 DIAGNOSIS — N289 Disorder of kidney and ureter, unspecified: Secondary | ICD-10-CM | POA: Diagnosis not present

## 2017-12-14 DIAGNOSIS — Z8673 Personal history of transient ischemic attack (TIA), and cerebral infarction without residual deficits: Secondary | ICD-10-CM | POA: Diagnosis not present

## 2017-12-14 DIAGNOSIS — R740 Nonspecific elevation of levels of transaminase and lactic acid dehydrogenase [LDH]: Secondary | ICD-10-CM | POA: Diagnosis not present

## 2017-12-14 DIAGNOSIS — Z113 Encounter for screening for infections with a predominantly sexual mode of transmission: Secondary | ICD-10-CM | POA: Diagnosis not present

## 2017-12-14 DIAGNOSIS — E1165 Type 2 diabetes mellitus with hyperglycemia: Secondary | ICD-10-CM | POA: Diagnosis not present

## 2017-12-14 DIAGNOSIS — I119 Hypertensive heart disease without heart failure: Secondary | ICD-10-CM | POA: Diagnosis not present

## 2017-12-14 DIAGNOSIS — F0280 Dementia in other diseases classified elsewhere without behavioral disturbance: Secondary | ICD-10-CM | POA: Diagnosis not present

## 2017-12-14 DIAGNOSIS — F5102 Adjustment insomnia: Secondary | ICD-10-CM | POA: Diagnosis not present

## 2017-12-14 DIAGNOSIS — E785 Hyperlipidemia, unspecified: Secondary | ICD-10-CM | POA: Diagnosis not present

## 2017-12-17 DIAGNOSIS — I10 Essential (primary) hypertension: Secondary | ICD-10-CM | POA: Diagnosis not present

## 2017-12-17 DIAGNOSIS — Z8701 Personal history of pneumonia (recurrent): Secondary | ICD-10-CM | POA: Diagnosis not present

## 2017-12-17 DIAGNOSIS — F419 Anxiety disorder, unspecified: Secondary | ICD-10-CM | POA: Diagnosis not present

## 2017-12-17 DIAGNOSIS — E119 Type 2 diabetes mellitus without complications: Secondary | ICD-10-CM | POA: Diagnosis not present

## 2017-12-17 DIAGNOSIS — F329 Major depressive disorder, single episode, unspecified: Secondary | ICD-10-CM | POA: Diagnosis not present

## 2017-12-17 DIAGNOSIS — G20 Parkinson's disease: Secondary | ICD-10-CM | POA: Diagnosis not present

## 2017-12-19 DIAGNOSIS — I10 Essential (primary) hypertension: Secondary | ICD-10-CM | POA: Diagnosis not present

## 2017-12-19 DIAGNOSIS — G20 Parkinson's disease: Secondary | ICD-10-CM | POA: Diagnosis not present

## 2017-12-19 DIAGNOSIS — Z8701 Personal history of pneumonia (recurrent): Secondary | ICD-10-CM | POA: Diagnosis not present

## 2017-12-19 DIAGNOSIS — E119 Type 2 diabetes mellitus without complications: Secondary | ICD-10-CM | POA: Diagnosis not present

## 2017-12-19 DIAGNOSIS — F329 Major depressive disorder, single episode, unspecified: Secondary | ICD-10-CM | POA: Diagnosis not present

## 2017-12-19 DIAGNOSIS — F419 Anxiety disorder, unspecified: Secondary | ICD-10-CM | POA: Diagnosis not present

## 2017-12-21 DIAGNOSIS — Z8701 Personal history of pneumonia (recurrent): Secondary | ICD-10-CM | POA: Diagnosis not present

## 2017-12-21 DIAGNOSIS — G20 Parkinson's disease: Secondary | ICD-10-CM | POA: Diagnosis not present

## 2017-12-21 DIAGNOSIS — F329 Major depressive disorder, single episode, unspecified: Secondary | ICD-10-CM | POA: Diagnosis not present

## 2017-12-21 DIAGNOSIS — I10 Essential (primary) hypertension: Secondary | ICD-10-CM | POA: Diagnosis not present

## 2017-12-21 DIAGNOSIS — E119 Type 2 diabetes mellitus without complications: Secondary | ICD-10-CM | POA: Diagnosis not present

## 2017-12-21 DIAGNOSIS — F419 Anxiety disorder, unspecified: Secondary | ICD-10-CM | POA: Diagnosis not present

## 2017-12-25 DIAGNOSIS — E119 Type 2 diabetes mellitus without complications: Secondary | ICD-10-CM | POA: Diagnosis not present

## 2017-12-25 DIAGNOSIS — R933 Abnormal findings on diagnostic imaging of other parts of digestive tract: Secondary | ICD-10-CM | POA: Diagnosis not present

## 2017-12-25 DIAGNOSIS — Z8701 Personal history of pneumonia (recurrent): Secondary | ICD-10-CM | POA: Diagnosis not present

## 2017-12-25 DIAGNOSIS — F419 Anxiety disorder, unspecified: Secondary | ICD-10-CM | POA: Diagnosis not present

## 2017-12-25 DIAGNOSIS — I10 Essential (primary) hypertension: Secondary | ICD-10-CM | POA: Diagnosis not present

## 2017-12-25 DIAGNOSIS — F329 Major depressive disorder, single episode, unspecified: Secondary | ICD-10-CM | POA: Diagnosis not present

## 2017-12-25 DIAGNOSIS — G20 Parkinson's disease: Secondary | ICD-10-CM | POA: Diagnosis not present

## 2017-12-25 DIAGNOSIS — D649 Anemia, unspecified: Secondary | ICD-10-CM | POA: Diagnosis not present

## 2017-12-27 DIAGNOSIS — F419 Anxiety disorder, unspecified: Secondary | ICD-10-CM | POA: Diagnosis not present

## 2017-12-27 DIAGNOSIS — Z8701 Personal history of pneumonia (recurrent): Secondary | ICD-10-CM | POA: Diagnosis not present

## 2017-12-27 DIAGNOSIS — I10 Essential (primary) hypertension: Secondary | ICD-10-CM | POA: Diagnosis not present

## 2017-12-27 DIAGNOSIS — F329 Major depressive disorder, single episode, unspecified: Secondary | ICD-10-CM | POA: Diagnosis not present

## 2017-12-27 DIAGNOSIS — G20 Parkinson's disease: Secondary | ICD-10-CM | POA: Diagnosis not present

## 2017-12-27 DIAGNOSIS — E119 Type 2 diabetes mellitus without complications: Secondary | ICD-10-CM | POA: Diagnosis not present

## 2018-01-01 DIAGNOSIS — R933 Abnormal findings on diagnostic imaging of other parts of digestive tract: Secondary | ICD-10-CM | POA: Diagnosis not present

## 2018-01-01 DIAGNOSIS — Q408 Other specified congenital malformations of upper alimentary tract: Secondary | ICD-10-CM | POA: Diagnosis not present

## 2018-01-01 DIAGNOSIS — K2980 Duodenitis without bleeding: Secondary | ICD-10-CM | POA: Diagnosis not present

## 2018-01-01 DIAGNOSIS — K317 Polyp of stomach and duodenum: Secondary | ICD-10-CM | POA: Diagnosis not present

## 2018-01-02 DIAGNOSIS — Z8701 Personal history of pneumonia (recurrent): Secondary | ICD-10-CM | POA: Diagnosis not present

## 2018-01-02 DIAGNOSIS — F419 Anxiety disorder, unspecified: Secondary | ICD-10-CM | POA: Diagnosis not present

## 2018-01-02 DIAGNOSIS — E119 Type 2 diabetes mellitus without complications: Secondary | ICD-10-CM | POA: Diagnosis not present

## 2018-01-02 DIAGNOSIS — F329 Major depressive disorder, single episode, unspecified: Secondary | ICD-10-CM | POA: Diagnosis not present

## 2018-01-02 DIAGNOSIS — G20 Parkinson's disease: Secondary | ICD-10-CM | POA: Diagnosis not present

## 2018-01-02 DIAGNOSIS — I10 Essential (primary) hypertension: Secondary | ICD-10-CM | POA: Diagnosis not present

## 2018-01-08 DIAGNOSIS — F419 Anxiety disorder, unspecified: Secondary | ICD-10-CM | POA: Diagnosis not present

## 2018-01-08 DIAGNOSIS — I10 Essential (primary) hypertension: Secondary | ICD-10-CM | POA: Diagnosis not present

## 2018-01-08 DIAGNOSIS — E119 Type 2 diabetes mellitus without complications: Secondary | ICD-10-CM | POA: Diagnosis not present

## 2018-01-08 DIAGNOSIS — Z8701 Personal history of pneumonia (recurrent): Secondary | ICD-10-CM | POA: Diagnosis not present

## 2018-01-08 DIAGNOSIS — G20 Parkinson's disease: Secondary | ICD-10-CM | POA: Diagnosis not present

## 2018-01-08 DIAGNOSIS — F329 Major depressive disorder, single episode, unspecified: Secondary | ICD-10-CM | POA: Diagnosis not present

## 2018-01-11 DIAGNOSIS — F329 Major depressive disorder, single episode, unspecified: Secondary | ICD-10-CM | POA: Diagnosis not present

## 2018-01-11 DIAGNOSIS — F419 Anxiety disorder, unspecified: Secondary | ICD-10-CM | POA: Diagnosis not present

## 2018-01-11 DIAGNOSIS — E119 Type 2 diabetes mellitus without complications: Secondary | ICD-10-CM | POA: Diagnosis not present

## 2018-01-11 DIAGNOSIS — I10 Essential (primary) hypertension: Secondary | ICD-10-CM | POA: Diagnosis not present

## 2018-01-11 DIAGNOSIS — G20 Parkinson's disease: Secondary | ICD-10-CM | POA: Diagnosis not present

## 2018-01-11 DIAGNOSIS — Z8701 Personal history of pneumonia (recurrent): Secondary | ICD-10-CM | POA: Diagnosis not present

## 2018-01-14 DIAGNOSIS — E119 Type 2 diabetes mellitus without complications: Secondary | ICD-10-CM | POA: Diagnosis not present

## 2018-01-14 DIAGNOSIS — I10 Essential (primary) hypertension: Secondary | ICD-10-CM | POA: Diagnosis not present

## 2018-01-14 DIAGNOSIS — E162 Hypoglycemia, unspecified: Secondary | ICD-10-CM | POA: Diagnosis not present

## 2018-01-14 DIAGNOSIS — R4020 Unspecified coma: Secondary | ICD-10-CM | POA: Diagnosis not present

## 2018-01-14 DIAGNOSIS — E161 Other hypoglycemia: Secondary | ICD-10-CM | POA: Diagnosis not present

## 2018-01-14 DIAGNOSIS — F329 Major depressive disorder, single episode, unspecified: Secondary | ICD-10-CM | POA: Diagnosis not present

## 2018-01-14 DIAGNOSIS — F419 Anxiety disorder, unspecified: Secondary | ICD-10-CM | POA: Diagnosis not present

## 2018-01-14 DIAGNOSIS — Z8701 Personal history of pneumonia (recurrent): Secondary | ICD-10-CM | POA: Diagnosis not present

## 2018-01-14 DIAGNOSIS — R404 Transient alteration of awareness: Secondary | ICD-10-CM | POA: Diagnosis not present

## 2018-01-14 DIAGNOSIS — G20 Parkinson's disease: Secondary | ICD-10-CM | POA: Diagnosis not present

## 2018-01-15 DIAGNOSIS — Z8701 Personal history of pneumonia (recurrent): Secondary | ICD-10-CM | POA: Diagnosis not present

## 2018-01-15 DIAGNOSIS — I10 Essential (primary) hypertension: Secondary | ICD-10-CM | POA: Diagnosis not present

## 2018-01-15 DIAGNOSIS — E119 Type 2 diabetes mellitus without complications: Secondary | ICD-10-CM | POA: Diagnosis not present

## 2018-01-15 DIAGNOSIS — F329 Major depressive disorder, single episode, unspecified: Secondary | ICD-10-CM | POA: Diagnosis not present

## 2018-01-15 DIAGNOSIS — G20 Parkinson's disease: Secondary | ICD-10-CM | POA: Diagnosis not present

## 2018-01-15 DIAGNOSIS — F419 Anxiety disorder, unspecified: Secondary | ICD-10-CM | POA: Diagnosis not present

## 2018-01-17 DIAGNOSIS — G20 Parkinson's disease: Secondary | ICD-10-CM | POA: Diagnosis not present

## 2018-01-17 DIAGNOSIS — F419 Anxiety disorder, unspecified: Secondary | ICD-10-CM | POA: Diagnosis not present

## 2018-01-17 DIAGNOSIS — I10 Essential (primary) hypertension: Secondary | ICD-10-CM | POA: Diagnosis not present

## 2018-01-17 DIAGNOSIS — F329 Major depressive disorder, single episode, unspecified: Secondary | ICD-10-CM | POA: Diagnosis not present

## 2018-01-17 DIAGNOSIS — Z8701 Personal history of pneumonia (recurrent): Secondary | ICD-10-CM | POA: Diagnosis not present

## 2018-01-17 DIAGNOSIS — E119 Type 2 diabetes mellitus without complications: Secondary | ICD-10-CM | POA: Diagnosis not present

## 2018-01-22 DIAGNOSIS — F419 Anxiety disorder, unspecified: Secondary | ICD-10-CM | POA: Diagnosis not present

## 2018-01-22 DIAGNOSIS — G20 Parkinson's disease: Secondary | ICD-10-CM | POA: Diagnosis not present

## 2018-01-22 DIAGNOSIS — F329 Major depressive disorder, single episode, unspecified: Secondary | ICD-10-CM | POA: Diagnosis not present

## 2018-01-22 DIAGNOSIS — I10 Essential (primary) hypertension: Secondary | ICD-10-CM | POA: Diagnosis not present

## 2018-01-22 DIAGNOSIS — E119 Type 2 diabetes mellitus without complications: Secondary | ICD-10-CM | POA: Diagnosis not present

## 2018-01-22 DIAGNOSIS — Z8701 Personal history of pneumonia (recurrent): Secondary | ICD-10-CM | POA: Diagnosis not present

## 2018-01-23 DIAGNOSIS — F419 Anxiety disorder, unspecified: Secondary | ICD-10-CM | POA: Diagnosis not present

## 2018-01-23 DIAGNOSIS — G20 Parkinson's disease: Secondary | ICD-10-CM | POA: Diagnosis not present

## 2018-01-23 DIAGNOSIS — E119 Type 2 diabetes mellitus without complications: Secondary | ICD-10-CM | POA: Diagnosis not present

## 2018-01-23 DIAGNOSIS — I10 Essential (primary) hypertension: Secondary | ICD-10-CM | POA: Diagnosis not present

## 2018-01-23 DIAGNOSIS — F329 Major depressive disorder, single episode, unspecified: Secondary | ICD-10-CM | POA: Diagnosis not present

## 2018-01-23 DIAGNOSIS — Z8701 Personal history of pneumonia (recurrent): Secondary | ICD-10-CM | POA: Diagnosis not present

## 2018-01-30 DIAGNOSIS — F0280 Dementia in other diseases classified elsewhere without behavioral disturbance: Secondary | ICD-10-CM | POA: Diagnosis not present

## 2018-01-30 DIAGNOSIS — R079 Chest pain, unspecified: Secondary | ICD-10-CM | POA: Diagnosis not present

## 2018-01-30 DIAGNOSIS — Z7982 Long term (current) use of aspirin: Secondary | ICD-10-CM | POA: Diagnosis not present

## 2018-01-30 DIAGNOSIS — Z87891 Personal history of nicotine dependence: Secondary | ICD-10-CM | POA: Diagnosis not present

## 2018-01-30 DIAGNOSIS — F329 Major depressive disorder, single episode, unspecified: Secondary | ICD-10-CM | POA: Diagnosis not present

## 2018-01-30 DIAGNOSIS — N183 Chronic kidney disease, stage 3 (moderate): Secondary | ICD-10-CM | POA: Diagnosis not present

## 2018-01-30 DIAGNOSIS — G8929 Other chronic pain: Secondary | ICD-10-CM | POA: Diagnosis not present

## 2018-01-30 DIAGNOSIS — M542 Cervicalgia: Secondary | ICD-10-CM | POA: Diagnosis not present

## 2018-01-30 DIAGNOSIS — I252 Old myocardial infarction: Secondary | ICD-10-CM | POA: Diagnosis not present

## 2018-01-30 DIAGNOSIS — E785 Hyperlipidemia, unspecified: Secondary | ICD-10-CM | POA: Diagnosis not present

## 2018-01-30 DIAGNOSIS — Z955 Presence of coronary angioplasty implant and graft: Secondary | ICD-10-CM | POA: Diagnosis not present

## 2018-01-30 DIAGNOSIS — R0789 Other chest pain: Secondary | ICD-10-CM | POA: Diagnosis not present

## 2018-01-30 DIAGNOSIS — Z23 Encounter for immunization: Secondary | ICD-10-CM | POA: Diagnosis not present

## 2018-01-30 DIAGNOSIS — R7989 Other specified abnormal findings of blood chemistry: Secondary | ICD-10-CM | POA: Diagnosis not present

## 2018-01-30 DIAGNOSIS — I2510 Atherosclerotic heart disease of native coronary artery without angina pectoris: Secondary | ICD-10-CM | POA: Diagnosis not present

## 2018-01-30 DIAGNOSIS — I700 Atherosclerosis of aorta: Secondary | ICD-10-CM | POA: Diagnosis not present

## 2018-01-30 DIAGNOSIS — M25511 Pain in right shoulder: Secondary | ICD-10-CM | POA: Diagnosis not present

## 2018-01-30 DIAGNOSIS — I130 Hypertensive heart and chronic kidney disease with heart failure and stage 1 through stage 4 chronic kidney disease, or unspecified chronic kidney disease: Secondary | ICD-10-CM | POA: Diagnosis not present

## 2018-01-30 DIAGNOSIS — F419 Anxiety disorder, unspecified: Secondary | ICD-10-CM | POA: Diagnosis not present

## 2018-01-30 DIAGNOSIS — E1165 Type 2 diabetes mellitus with hyperglycemia: Secondary | ICD-10-CM | POA: Diagnosis not present

## 2018-01-30 DIAGNOSIS — R200 Anesthesia of skin: Secondary | ICD-10-CM | POA: Diagnosis not present

## 2018-01-30 DIAGNOSIS — E1122 Type 2 diabetes mellitus with diabetic chronic kidney disease: Secondary | ICD-10-CM | POA: Diagnosis not present

## 2018-01-30 DIAGNOSIS — I509 Heart failure, unspecified: Secondary | ICD-10-CM | POA: Diagnosis not present

## 2018-01-30 DIAGNOSIS — R072 Precordial pain: Secondary | ICD-10-CM | POA: Diagnosis not present

## 2018-01-30 DIAGNOSIS — Z7984 Long term (current) use of oral hypoglycemic drugs: Secondary | ICD-10-CM | POA: Diagnosis not present

## 2018-01-30 DIAGNOSIS — R001 Bradycardia, unspecified: Secondary | ICD-10-CM | POA: Diagnosis not present

## 2018-01-30 DIAGNOSIS — Z79899 Other long term (current) drug therapy: Secondary | ICD-10-CM | POA: Diagnosis not present

## 2018-01-30 DIAGNOSIS — Z7902 Long term (current) use of antithrombotics/antiplatelets: Secondary | ICD-10-CM | POA: Diagnosis not present

## 2018-01-30 DIAGNOSIS — G20 Parkinson's disease: Secondary | ICD-10-CM | POA: Diagnosis not present

## 2018-01-30 DIAGNOSIS — I10 Essential (primary) hypertension: Secondary | ICD-10-CM | POA: Diagnosis not present

## 2018-01-30 DIAGNOSIS — E039 Hypothyroidism, unspecified: Secondary | ICD-10-CM | POA: Diagnosis not present

## 2018-01-31 DIAGNOSIS — R079 Chest pain, unspecified: Secondary | ICD-10-CM | POA: Diagnosis not present

## 2018-01-31 DIAGNOSIS — R072 Precordial pain: Secondary | ICD-10-CM | POA: Diagnosis not present

## 2018-01-31 DIAGNOSIS — F419 Anxiety disorder, unspecified: Secondary | ICD-10-CM | POA: Diagnosis not present

## 2018-01-31 DIAGNOSIS — E1165 Type 2 diabetes mellitus with hyperglycemia: Secondary | ICD-10-CM | POA: Diagnosis not present

## 2018-01-31 DIAGNOSIS — E039 Hypothyroidism, unspecified: Secondary | ICD-10-CM | POA: Diagnosis not present

## 2018-01-31 DIAGNOSIS — I10 Essential (primary) hypertension: Secondary | ICD-10-CM | POA: Diagnosis not present

## 2018-01-31 DIAGNOSIS — Z794 Long term (current) use of insulin: Secondary | ICD-10-CM | POA: Diagnosis not present

## 2018-02-01 DIAGNOSIS — I10 Essential (primary) hypertension: Secondary | ICD-10-CM | POA: Diagnosis not present

## 2018-02-01 DIAGNOSIS — I119 Hypertensive heart disease without heart failure: Secondary | ICD-10-CM | POA: Diagnosis not present

## 2018-02-01 DIAGNOSIS — L989 Disorder of the skin and subcutaneous tissue, unspecified: Secondary | ICD-10-CM | POA: Diagnosis not present

## 2018-02-01 DIAGNOSIS — F0280 Dementia in other diseases classified elsewhere without behavioral disturbance: Secondary | ICD-10-CM | POA: Diagnosis not present

## 2018-02-01 DIAGNOSIS — E1165 Type 2 diabetes mellitus with hyperglycemia: Secondary | ICD-10-CM | POA: Diagnosis not present

## 2018-02-01 DIAGNOSIS — E039 Hypothyroidism, unspecified: Secondary | ICD-10-CM | POA: Diagnosis not present

## 2018-02-01 DIAGNOSIS — G8191 Hemiplegia, unspecified affecting right dominant side: Secondary | ICD-10-CM | POA: Diagnosis not present

## 2018-02-01 DIAGNOSIS — E785 Hyperlipidemia, unspecified: Secondary | ICD-10-CM | POA: Diagnosis not present

## 2018-02-01 DIAGNOSIS — Z8673 Personal history of transient ischemic attack (TIA), and cerebral infarction without residual deficits: Secondary | ICD-10-CM | POA: Diagnosis not present

## 2018-02-01 DIAGNOSIS — G4700 Insomnia, unspecified: Secondary | ICD-10-CM | POA: Diagnosis not present

## 2018-02-01 DIAGNOSIS — F3341 Major depressive disorder, recurrent, in partial remission: Secondary | ICD-10-CM | POA: Diagnosis not present

## 2018-02-16 DIAGNOSIS — E1165 Type 2 diabetes mellitus with hyperglycemia: Secondary | ICD-10-CM | POA: Diagnosis not present

## 2018-02-16 DIAGNOSIS — I10 Essential (primary) hypertension: Secondary | ICD-10-CM | POA: Diagnosis not present

## 2018-02-16 DIAGNOSIS — R5381 Other malaise: Secondary | ICD-10-CM | POA: Diagnosis not present

## 2018-02-16 DIAGNOSIS — R51 Headache: Secondary | ICD-10-CM | POA: Diagnosis not present

## 2018-02-16 DIAGNOSIS — R05 Cough: Secondary | ICD-10-CM | POA: Diagnosis not present

## 2018-02-16 DIAGNOSIS — R531 Weakness: Secondary | ICD-10-CM | POA: Diagnosis not present

## 2018-02-16 DIAGNOSIS — Z794 Long term (current) use of insulin: Secondary | ICD-10-CM | POA: Diagnosis not present

## 2018-02-16 DIAGNOSIS — R5383 Other fatigue: Secondary | ICD-10-CM | POA: Diagnosis not present

## 2018-02-16 DIAGNOSIS — R350 Frequency of micturition: Secondary | ICD-10-CM | POA: Diagnosis not present

## 2018-02-18 DIAGNOSIS — R531 Weakness: Secondary | ICD-10-CM | POA: Diagnosis not present

## 2018-05-03 DIAGNOSIS — E785 Hyperlipidemia, unspecified: Secondary | ICD-10-CM | POA: Diagnosis not present

## 2018-05-03 DIAGNOSIS — F0280 Dementia in other diseases classified elsewhere without behavioral disturbance: Secondary | ICD-10-CM | POA: Diagnosis not present

## 2018-05-03 DIAGNOSIS — E1165 Type 2 diabetes mellitus with hyperglycemia: Secondary | ICD-10-CM | POA: Diagnosis not present

## 2018-05-03 DIAGNOSIS — Z8673 Personal history of transient ischemic attack (TIA), and cerebral infarction without residual deficits: Secondary | ICD-10-CM | POA: Diagnosis not present

## 2018-05-03 DIAGNOSIS — F3341 Major depressive disorder, recurrent, in partial remission: Secondary | ICD-10-CM | POA: Diagnosis not present

## 2018-05-03 DIAGNOSIS — G4700 Insomnia, unspecified: Secondary | ICD-10-CM | POA: Diagnosis not present

## 2018-05-03 DIAGNOSIS — I10 Essential (primary) hypertension: Secondary | ICD-10-CM | POA: Diagnosis not present

## 2018-05-03 DIAGNOSIS — I119 Hypertensive heart disease without heart failure: Secondary | ICD-10-CM | POA: Diagnosis not present

## 2018-05-03 DIAGNOSIS — G8191 Hemiplegia, unspecified affecting right dominant side: Secondary | ICD-10-CM | POA: Diagnosis not present

## 2018-05-03 DIAGNOSIS — E039 Hypothyroidism, unspecified: Secondary | ICD-10-CM | POA: Diagnosis not present

## 2018-06-07 DIAGNOSIS — R51 Headache: Secondary | ICD-10-CM | POA: Diagnosis not present

## 2018-06-07 DIAGNOSIS — E1165 Type 2 diabetes mellitus with hyperglycemia: Secondary | ICD-10-CM | POA: Diagnosis not present

## 2018-06-07 DIAGNOSIS — R404 Transient alteration of awareness: Secondary | ICD-10-CM | POA: Diagnosis not present

## 2018-06-07 DIAGNOSIS — R52 Pain, unspecified: Secondary | ICD-10-CM | POA: Diagnosis not present

## 2018-06-07 DIAGNOSIS — R569 Unspecified convulsions: Secondary | ICD-10-CM | POA: Diagnosis not present

## 2018-06-08 DIAGNOSIS — R51 Headache: Secondary | ICD-10-CM | POA: Diagnosis not present

## 2018-06-19 DIAGNOSIS — S0990XA Unspecified injury of head, initial encounter: Secondary | ICD-10-CM | POA: Diagnosis not present

## 2018-06-19 DIAGNOSIS — R52 Pain, unspecified: Secondary | ICD-10-CM | POA: Diagnosis not present

## 2018-06-19 DIAGNOSIS — F0390 Unspecified dementia without behavioral disturbance: Secondary | ICD-10-CM | POA: Diagnosis not present

## 2018-06-19 DIAGNOSIS — W1839XA Other fall on same level, initial encounter: Secondary | ICD-10-CM | POA: Diagnosis not present

## 2018-06-19 DIAGNOSIS — Z87891 Personal history of nicotine dependence: Secondary | ICD-10-CM | POA: Diagnosis not present

## 2018-06-19 DIAGNOSIS — Y999 Unspecified external cause status: Secondary | ICD-10-CM | POA: Diagnosis not present

## 2018-06-19 DIAGNOSIS — S8992XA Unspecified injury of left lower leg, initial encounter: Secondary | ICD-10-CM | POA: Diagnosis not present

## 2018-06-19 DIAGNOSIS — Y9301 Activity, walking, marching and hiking: Secondary | ICD-10-CM | POA: Diagnosis not present

## 2018-06-19 DIAGNOSIS — R51 Headache: Secondary | ICD-10-CM | POA: Diagnosis not present

## 2018-06-19 DIAGNOSIS — E1165 Type 2 diabetes mellitus with hyperglycemia: Secondary | ICD-10-CM | POA: Diagnosis not present

## 2018-06-19 DIAGNOSIS — I10 Essential (primary) hypertension: Secondary | ICD-10-CM | POA: Diagnosis not present

## 2018-06-19 DIAGNOSIS — M542 Cervicalgia: Secondary | ICD-10-CM | POA: Diagnosis not present

## 2018-06-19 DIAGNOSIS — R569 Unspecified convulsions: Secondary | ICD-10-CM | POA: Diagnosis not present

## 2018-06-19 DIAGNOSIS — S199XXA Unspecified injury of neck, initial encounter: Secondary | ICD-10-CM | POA: Diagnosis not present

## 2018-06-19 DIAGNOSIS — M25562 Pain in left knee: Secondary | ICD-10-CM | POA: Diagnosis not present

## 2018-06-28 DIAGNOSIS — E785 Hyperlipidemia, unspecified: Secondary | ICD-10-CM | POA: Diagnosis not present

## 2018-06-28 DIAGNOSIS — I119 Hypertensive heart disease without heart failure: Secondary | ICD-10-CM | POA: Diagnosis not present

## 2018-06-28 DIAGNOSIS — G8191 Hemiplegia, unspecified affecting right dominant side: Secondary | ICD-10-CM | POA: Diagnosis not present

## 2018-06-28 DIAGNOSIS — E039 Hypothyroidism, unspecified: Secondary | ICD-10-CM | POA: Diagnosis not present

## 2018-06-28 DIAGNOSIS — F3341 Major depressive disorder, recurrent, in partial remission: Secondary | ICD-10-CM | POA: Diagnosis not present

## 2018-06-28 DIAGNOSIS — E1165 Type 2 diabetes mellitus with hyperglycemia: Secondary | ICD-10-CM | POA: Diagnosis not present

## 2018-06-28 DIAGNOSIS — R21 Rash and other nonspecific skin eruption: Secondary | ICD-10-CM | POA: Diagnosis not present

## 2018-06-28 DIAGNOSIS — G4700 Insomnia, unspecified: Secondary | ICD-10-CM | POA: Diagnosis not present

## 2018-06-28 DIAGNOSIS — I10 Essential (primary) hypertension: Secondary | ICD-10-CM | POA: Diagnosis not present

## 2018-06-28 DIAGNOSIS — F0280 Dementia in other diseases classified elsewhere without behavioral disturbance: Secondary | ICD-10-CM | POA: Diagnosis not present

## 2018-06-28 DIAGNOSIS — Z8673 Personal history of transient ischemic attack (TIA), and cerebral infarction without residual deficits: Secondary | ICD-10-CM | POA: Diagnosis not present

## 2018-08-31 DIAGNOSIS — M2550 Pain in unspecified joint: Secondary | ICD-10-CM | POA: Diagnosis not present

## 2018-08-31 DIAGNOSIS — R4182 Altered mental status, unspecified: Secondary | ICD-10-CM | POA: Diagnosis not present

## 2018-08-31 DIAGNOSIS — R569 Unspecified convulsions: Secondary | ICD-10-CM | POA: Diagnosis not present

## 2018-08-31 DIAGNOSIS — I6782 Cerebral ischemia: Secondary | ICD-10-CM | POA: Diagnosis not present

## 2018-08-31 DIAGNOSIS — R4689 Other symptoms and signs involving appearance and behavior: Secondary | ICD-10-CM | POA: Diagnosis not present

## 2018-08-31 DIAGNOSIS — R410 Disorientation, unspecified: Secondary | ICD-10-CM | POA: Diagnosis not present

## 2018-08-31 DIAGNOSIS — R0989 Other specified symptoms and signs involving the circulatory and respiratory systems: Secondary | ICD-10-CM | POA: Diagnosis not present

## 2018-08-31 DIAGNOSIS — Z7401 Bed confinement status: Secondary | ICD-10-CM | POA: Diagnosis not present

## 2018-09-10 DIAGNOSIS — F0390 Unspecified dementia without behavioral disturbance: Secondary | ICD-10-CM

## 2018-09-10 DIAGNOSIS — G20 Parkinson's disease: Secondary | ICD-10-CM

## 2018-09-10 DIAGNOSIS — G20A1 Parkinson's disease without dyskinesia, without mention of fluctuations: Secondary | ICD-10-CM | POA: Insufficient documentation

## 2018-09-10 DIAGNOSIS — I679 Cerebrovascular disease, unspecified: Secondary | ICD-10-CM | POA: Insufficient documentation

## 2018-09-11 ENCOUNTER — Ambulatory Visit: Payer: Self-pay | Admitting: Cardiology

## 2018-09-11 NOTE — Progress Notes (Deleted)
Follow up visit  Subjective:   Cheryl Morse, female    DOB: 1950-07-16, 68 y.o.   MRN: [id number]   No chief complaint on file.   *** HPI  68 year old Caucasian female with hypertension, uncontrolled type II diabetes mellitus, coronary artery disease status post prior multiple PCI, history of CVA, Parkinson's disease, dementia.  *** Patient is doing well without any complaints. She has stopped taking plavix since last office visit. She denies any chest pain. shortness of breath, leg edema, orthopnea, PND, presncope, syncope, TIA. She is going to establish care with a neurologist in town for her dementia and Parkinson's disease.  ***  Patient has history of hepatitis with elevated liver enzymes in 01/2017, for which she was admitted to hospital in [place].  Right upper quadrant ultrasound was suspicious for acute hepatitis.  Prior medical history his up and from patient and her daughter. There are significant gaps and I do not have any prior medical records available. Per the daughter, patient has had multiple stents placed in the past, mostly at [facility], and [facility]. Patient last saw her previous cardiologist in [place], [place]- Dr. Tarri Abernethy, in 11/2016. Her last hospital admission was in January 29 19 at [facility] with an episode of seizure.  Patient has limited baseline functional capacity. She is able to walk to the bathroom, and dress herself. She rarely goes out to [place] with her daughter. She is able to walk at [facility] without any difficulty. She does report occasional leg swelling and dyspnea on exertion on walking to the bathroom. She has just establish primary care with Dr. Jackson Latino. She does not have a neurologist.  I personally reviewed all medication's as per PCP's note, and list patient has with her from [place].  *** Past Medical History:  Diagnosis Date  . Cerebrovascular disease    . Coronary artery disease   . Dementia ([place])   . Hyperlipidemia   . Parkinson disease ([place])     *** Past Surgical History:  Procedure Laterality Date  . APPENDECTOMY    . EXCISION MASS HEAD    . EXPLORATORY LAPAROTOMY    . THYROIDECTOMY    . TONSILLECTOMY    . TUBAL LIGATION      *** Social History   Socioeconomic History  . Marital status: Widowed    Spouse name: Not on file  . Number of children: Not on file  . Years of education: Not on file  . Highest education level: Not on file  Occupational History  . Not on file  Social Needs  . Financial resource strain: Not on file  . Food insecurity:    Worry: Not on file    Inability: Not on file  . Transportation needs:    Medical: Not on file    Non-medical: Not on file  Tobacco Use  . Smoking status: Former Smoker    Packs/day: 1.00    Years: 60.00    Pack years: 60.00    Types: Cigarettes    Last attempt to quit: 09/09/2008    Years since quitting: 10.0  . Smokeless tobacco: Never Used  Substance and Sexual Activity  . Alcohol use: Not on file  . Drug use: Not on file  . Sexual activity: Not on file  Lifestyle  . Physical activity:    Days per week: Not on file    Minutes per session: Not on file  . Stress: Not on file  Relationships  .  Social connections:    Talks on phone: Not on file    Gets together: Not on file    Attends religious service: Not on file    Active member of club or organization: Not on file    Attends meetings of clubs or organizations: Not on file    Relationship status: Not on file  . Intimate partner violence:    Fear of current or ex partner: Not on file    Emotionally abused: Not on file    Physically abused: Not on file    Forced sexual activity: Not on file  Other Topics Concern  . Not on file  Social History Narrative  . Not on file    *** Family History  Problem Relation Age of Onset  . COPD Mother   . Heart attack Father     *** Current Outpatient  Medications on File Prior to Visit  Medication Sig Dispense Refill  . amLODipine-benazepril (LOTREL) 5-10 MG capsule Take 1 capsule by mouth daily.    Marland Kitchen aspirin EC 81 MG tablet Take 81 mg by mouth daily.    . busPIRone (BUSPAR) 30 MG tablet Take 30 mg by mouth 2 (two) times daily.    . calcitRIOL (ROCALTROL) 0.25 MCG capsule Take 0.25 mcg by mouth daily.    . carbidopa-levodopa (PARCOPA) 25-100 MG disintegrating tablet Take 1 tablet by mouth 3 (three) times daily.    . carvedilol (COREG) 6.25 MG tablet Take 6.25 mg by mouth 2 (two) times daily with a meal.    . Cholecalciferol (CVS D3) 50 MCG (2000 UT) CAPS Take by mouth.    . clopidogrel (PLAVIX) 75 MG tablet Take 75 mg by mouth daily.    . empagliflozin (JARDIANCE) 10 MG TABS tablet Take 10 mg by mouth daily.    . furosemide (LASIX) 20 MG tablet Take 20 mg by mouth.    Marland Kitchen glipiZIDE (GLUCOTROL) 5 MG tablet Take by mouth daily before breakfast.    . insulin aspart (NOVOLOG FLEXPEN) 100 UNIT/ML FlexPen Inject into the skin 3 (three) times daily with meals. Sliding scale    . Insulin Degludec (TRESIBA) 100 UNIT/ML SOLN Inject 28 Units into the skin daily.    Marland Kitchen levothyroxine (SYNTHROID) 100 MCG tablet Take 100 mcg by mouth daily before breakfast.    . nitroGLYCERIN (NITROSTAT) 0.4 MG SL tablet Place 0.4 mg under the tongue every 5 (five) minutes as needed for chest pain.    . Probiotic Product (PRO-BIOTIC BLEND) CAPS Take by mouth.    . rosuvastatin (CRESTOR) 40 MG tablet Take 40 mg by mouth daily.    . traMADol (ULTRAM) 50 MG tablet Take by mouth every 6 (six) hours as needed.    . traZODone (DESYREL) 150 MG tablet Take 375 mg by mouth at bedtime.    Marland Kitchen venlafaxine XR (EFFEXOR-XR) 150 MG 24 hr capsule Take 300 mg by mouth daily with breakfast.     No current facility-administered medications on file prior to visit.     Cardiovascular studies:  ***  *** Recent labs: 04/13/2017: Chol 259, HDL 44, TG 362, LDL 159 H/H 15.5/40.4/MCV  88.4/Platelets 235 Glucose 577. BUN/Cr 16/1.52, eGFR 36, Na 135, K 4.3 TSH 94 (0.4-4.5) HbA1C 11.2  08/14/2016: H/H 11/33.  MCV 91.  Platelets 123 Glucose 168.  BUN/creatinine 17/1.3.  EGFR 43.  Sodium 143, potassium 4.8   ROS      *** There were no vitals filed for this visit.  Objective:   Physical Exam  Assessment & Recommendations:   ***  ***   Cerise Lieber Esther Hardy, MD [facility] Pager: [phone] Office: [phone] If no answer Cell [phone]

## 2018-09-19 ENCOUNTER — Ambulatory Visit: Payer: Self-pay | Admitting: Cardiology

## 2018-09-19 NOTE — Progress Notes (Deleted)
Follow up visit  Subjective:   Cheryl Morse, female    DOB: 1950-07-16, 68 y.o.   MRN: [id number]   No chief complaint on file.   *** HPI  68 year old Caucasian female with hypertension, uncontrolled type II diabetes mellitus, coronary artery disease status post prior multiple PCI, history of CVA, Parkinson's disease, dementia.  *** Patient is doing well without any complaints. She has stopped taking plavix since last office visit. She denies any chest pain. shortness of breath, leg edema, orthopnea, PND, presncope, syncope, TIA. She is going to establish care with a neurologist in town for her dementia and Parkinson's disease.  ***  Patient has history of hepatitis with elevated liver enzymes in 01/2017, for which she was admitted to hospital in [place].  Right upper quadrant ultrasound was suspicious for acute hepatitis.  Prior medical history his up and from patient and her daughter. There are significant gaps and I do not have any prior medical records available. Per the daughter, patient has had multiple stents placed in the past, mostly at [facility], and [facility]. Patient last saw her previous cardiologist in [place], [place]- Dr. Tarri Abernethy, in 11/2016. Her last hospital admission was in January 29 19 at [facility] with an episode of seizure.  Patient has limited baseline functional capacity. She is able to walk to the bathroom, and dress herself. She rarely goes out to [place] with her daughter. She is able to walk at [facility] without any difficulty. She does report occasional leg swelling and dyspnea on exertion on walking to the bathroom. She has just establish primary care with Dr. Jackson Latino. She does not have a neurologist.  I personally reviewed all medication's as per PCP's note, and list patient has with her from [place].  *** Past Medical History:  Diagnosis Date  . Cerebrovascular disease    . Coronary artery disease   . Dementia ([place])   . Hyperlipidemia   . Parkinson disease ([place])     *** Past Surgical History:  Procedure Laterality Date  . APPENDECTOMY    . EXCISION MASS HEAD    . EXPLORATORY LAPAROTOMY    . THYROIDECTOMY    . TONSILLECTOMY    . TUBAL LIGATION      *** Social History   Socioeconomic History  . Marital status: Widowed    Spouse name: Not on file  . Number of children: Not on file  . Years of education: Not on file  . Highest education level: Not on file  Occupational History  . Not on file  Social Needs  . Financial resource strain: Not on file  . Food insecurity:    Worry: Not on file    Inability: Not on file  . Transportation needs:    Medical: Not on file    Non-medical: Not on file  Tobacco Use  . Smoking status: Former Smoker    Packs/day: 1.00    Years: 60.00    Pack years: 60.00    Types: Cigarettes    Last attempt to quit: 09/09/2008    Years since quitting: 10.0  . Smokeless tobacco: Never Used  Substance and Sexual Activity  . Alcohol use: Not on file  . Drug use: Not on file  . Sexual activity: Not on file  Lifestyle  . Physical activity:    Days per week: Not on file    Minutes per session: Not on file  . Stress: Not on file  Relationships  .  Social connections:    Talks on phone: Not on file    Gets together: Not on file    Attends religious service: Not on file    Active member of club or organization: Not on file    Attends meetings of clubs or organizations: Not on file    Relationship status: Not on file  . Intimate partner violence:    Fear of current or ex partner: Not on file    Emotionally abused: Not on file    Physically abused: Not on file    Forced sexual activity: Not on file  Other Topics Concern  . Not on file  Social History Narrative  . Not on file    *** Family History  Problem Relation Age of Onset  . COPD Mother   . Heart attack Father     *** Current Outpatient  Medications on File Prior to Visit  Medication Sig Dispense Refill  . amLODipine-benazepril (LOTREL) 5-10 MG capsule Take 1 capsule by mouth daily.    Marland Kitchen aspirin EC 81 MG tablet Take 81 mg by mouth daily.    . busPIRone (BUSPAR) 30 MG tablet Take 30 mg by mouth 2 (two) times daily.    . calcitRIOL (ROCALTROL) 0.25 MCG capsule Take 0.25 mcg by mouth daily.    . carbidopa-levodopa (PARCOPA) 25-100 MG disintegrating tablet Take 1 tablet by mouth 3 (three) times daily.    . carvedilol (COREG) 6.25 MG tablet Take 6.25 mg by mouth 2 (two) times daily with a meal.    . Cholecalciferol (CVS D3) 50 MCG (2000 UT) CAPS Take by mouth.    . clopidogrel (PLAVIX) 75 MG tablet Take 75 mg by mouth daily.    . empagliflozin (JARDIANCE) 10 MG TABS tablet Take 10 mg by mouth daily.    . furosemide (LASIX) 20 MG tablet Take 20 mg by mouth.    Marland Kitchen glipiZIDE (GLUCOTROL) 5 MG tablet Take by mouth daily before breakfast.    . insulin aspart (NOVOLOG FLEXPEN) 100 UNIT/ML FlexPen Inject into the skin 3 (three) times daily with meals. Sliding scale    . Insulin Degludec (TRESIBA) 100 UNIT/ML SOLN Inject 28 Units into the skin daily.    Marland Kitchen levothyroxine (SYNTHROID) 100 MCG tablet Take 100 mcg by mouth daily before breakfast.    . nitroGLYCERIN (NITROSTAT) 0.4 MG SL tablet Place 0.4 mg under the tongue every 5 (five) minutes as needed for chest pain.    . Probiotic Product (PRO-BIOTIC BLEND) CAPS Take by mouth.    . rosuvastatin (CRESTOR) 40 MG tablet Take 40 mg by mouth daily.    . traMADol (ULTRAM) 50 MG tablet Take by mouth every 6 (six) hours as needed.    . traZODone (DESYREL) 150 MG tablet Take 375 mg by mouth at bedtime.    Marland Kitchen venlafaxine XR (EFFEXOR-XR) 150 MG 24 hr capsule Take 300 mg by mouth daily with breakfast.     No current facility-administered medications on file prior to visit.     Cardiovascular studies:  ***  *** Recent labs: 04/13/2017: Chol 259, HDL 44, TG 362, LDL 159 H/H 15.5/40.4/MCV  88.4/Platelets 235 Glucose 577. BUN/Cr 16/1.52, eGFR 36, Na 135, K 4.3 TSH 94 (0.4-4.5) HbA1C 11.2  08/14/2016: H/H 11/33.  MCV 91.  Platelets 123 Glucose 168.  BUN/creatinine 17/1.3.  EGFR 43.  Sodium 143, potassium 4.8   ROS      *** There were no vitals filed for this visit.  Objective:   Physical Exam  Assessment & Recommendations:   ***  ***   Annabel Gibeau Esther Hardy, MD [facility] Pager: [phone] Office: [phone] If no answer Cell [phone]

## 2018-09-22 DIAGNOSIS — E119 Type 2 diabetes mellitus without complications: Secondary | ICD-10-CM | POA: Diagnosis not present

## 2018-09-22 DIAGNOSIS — F0280 Dementia in other diseases classified elsewhere without behavioral disturbance: Secondary | ICD-10-CM | POA: Diagnosis not present

## 2018-09-22 DIAGNOSIS — E1165 Type 2 diabetes mellitus with hyperglycemia: Secondary | ICD-10-CM | POA: Diagnosis not present

## 2018-09-22 DIAGNOSIS — R064 Hyperventilation: Secondary | ICD-10-CM | POA: Diagnosis not present

## 2018-09-22 DIAGNOSIS — I700 Atherosclerosis of aorta: Secondary | ICD-10-CM | POA: Diagnosis not present

## 2018-09-22 DIAGNOSIS — Z7982 Long term (current) use of aspirin: Secondary | ICD-10-CM | POA: Diagnosis not present

## 2018-09-22 DIAGNOSIS — I10 Essential (primary) hypertension: Secondary | ICD-10-CM | POA: Diagnosis not present

## 2018-09-22 DIAGNOSIS — M160 Bilateral primary osteoarthritis of hip: Secondary | ICD-10-CM | POA: Diagnosis not present

## 2018-09-22 DIAGNOSIS — M11262 Other chondrocalcinosis, left knee: Secondary | ICD-10-CM | POA: Diagnosis not present

## 2018-09-22 DIAGNOSIS — M11251 Other chondrocalcinosis, right hip: Secondary | ICD-10-CM | POA: Diagnosis not present

## 2018-09-22 DIAGNOSIS — E039 Hypothyroidism, unspecified: Secondary | ICD-10-CM | POA: Diagnosis not present

## 2018-09-22 DIAGNOSIS — Y999 Unspecified external cause status: Secondary | ICD-10-CM | POA: Diagnosis not present

## 2018-09-22 DIAGNOSIS — Z955 Presence of coronary angioplasty implant and graft: Secondary | ICD-10-CM | POA: Diagnosis not present

## 2018-09-22 DIAGNOSIS — R079 Chest pain, unspecified: Secondary | ICD-10-CM | POA: Diagnosis not present

## 2018-09-22 DIAGNOSIS — I2510 Atherosclerotic heart disease of native coronary artery without angina pectoris: Secondary | ICD-10-CM | POA: Diagnosis not present

## 2018-09-22 DIAGNOSIS — I110 Hypertensive heart disease with heart failure: Secondary | ICD-10-CM | POA: Diagnosis not present

## 2018-09-22 DIAGNOSIS — Z79899 Other long term (current) drug therapy: Secondary | ICD-10-CM | POA: Diagnosis not present

## 2018-09-22 DIAGNOSIS — G20 Parkinson's disease: Secondary | ICD-10-CM | POA: Diagnosis not present

## 2018-09-22 DIAGNOSIS — R0600 Dyspnea, unspecified: Secondary | ICD-10-CM | POA: Diagnosis not present

## 2018-09-22 DIAGNOSIS — F419 Anxiety disorder, unspecified: Secondary | ICD-10-CM | POA: Diagnosis not present

## 2018-09-22 DIAGNOSIS — Z794 Long term (current) use of insulin: Secondary | ICD-10-CM | POA: Diagnosis not present

## 2018-09-22 DIAGNOSIS — Z87891 Personal history of nicotine dependence: Secondary | ICD-10-CM | POA: Diagnosis not present

## 2018-09-22 DIAGNOSIS — M11252 Other chondrocalcinosis, left hip: Secondary | ICD-10-CM | POA: Diagnosis not present

## 2018-09-22 DIAGNOSIS — Z7902 Long term (current) use of antithrombotics/antiplatelets: Secondary | ICD-10-CM | POA: Diagnosis not present

## 2018-09-22 DIAGNOSIS — I252 Old myocardial infarction: Secondary | ICD-10-CM | POA: Diagnosis not present

## 2018-09-22 DIAGNOSIS — W1839XA Other fall on same level, initial encounter: Secondary | ICD-10-CM | POA: Diagnosis not present

## 2018-09-22 DIAGNOSIS — R072 Precordial pain: Secondary | ICD-10-CM | POA: Diagnosis not present

## 2018-09-22 DIAGNOSIS — M47812 Spondylosis without myelopathy or radiculopathy, cervical region: Secondary | ICD-10-CM | POA: Diagnosis not present

## 2018-09-22 DIAGNOSIS — I509 Heart failure, unspecified: Secondary | ICD-10-CM | POA: Diagnosis not present

## 2018-09-22 DIAGNOSIS — R52 Pain, unspecified: Secondary | ICD-10-CM | POA: Diagnosis not present

## 2018-09-22 DIAGNOSIS — F329 Major depressive disorder, single episode, unspecified: Secondary | ICD-10-CM | POA: Diagnosis not present

## 2018-09-22 DIAGNOSIS — R0689 Other abnormalities of breathing: Secondary | ICD-10-CM | POA: Diagnosis not present

## 2018-09-22 DIAGNOSIS — F424 Excoriation (skin-picking) disorder: Secondary | ICD-10-CM | POA: Diagnosis not present

## 2018-09-23 DIAGNOSIS — S79912A Unspecified injury of left hip, initial encounter: Secondary | ICD-10-CM | POA: Diagnosis not present

## 2018-09-23 DIAGNOSIS — I252 Old myocardial infarction: Secondary | ICD-10-CM | POA: Diagnosis not present

## 2018-09-23 DIAGNOSIS — Z794 Long term (current) use of insulin: Secondary | ICD-10-CM | POA: Diagnosis not present

## 2018-09-23 DIAGNOSIS — S199XXA Unspecified injury of neck, initial encounter: Secondary | ICD-10-CM | POA: Diagnosis not present

## 2018-09-23 DIAGNOSIS — S299XXA Unspecified injury of thorax, initial encounter: Secondary | ICD-10-CM | POA: Diagnosis not present

## 2018-09-23 DIAGNOSIS — G20 Parkinson's disease: Secondary | ICD-10-CM | POA: Diagnosis not present

## 2018-09-23 DIAGNOSIS — M25552 Pain in left hip: Secondary | ICD-10-CM | POA: Diagnosis not present

## 2018-09-23 DIAGNOSIS — R9431 Abnormal electrocardiogram [ECG] [EKG]: Secondary | ICD-10-CM | POA: Diagnosis not present

## 2018-09-23 DIAGNOSIS — E119 Type 2 diabetes mellitus without complications: Secondary | ICD-10-CM | POA: Diagnosis not present

## 2018-09-23 DIAGNOSIS — M25562 Pain in left knee: Secondary | ICD-10-CM | POA: Diagnosis not present

## 2018-09-23 DIAGNOSIS — I10 Essential (primary) hypertension: Secondary | ICD-10-CM | POA: Diagnosis not present

## 2018-09-23 DIAGNOSIS — R0789 Other chest pain: Secondary | ICD-10-CM | POA: Diagnosis not present

## 2018-09-23 DIAGNOSIS — S8992XA Unspecified injury of left lower leg, initial encounter: Secondary | ICD-10-CM | POA: Diagnosis not present

## 2018-09-23 DIAGNOSIS — S79911A Unspecified injury of right hip, initial encounter: Secondary | ICD-10-CM | POA: Diagnosis not present

## 2018-09-23 DIAGNOSIS — R079 Chest pain, unspecified: Secondary | ICD-10-CM | POA: Diagnosis not present

## 2018-09-23 DIAGNOSIS — S0990XA Unspecified injury of head, initial encounter: Secondary | ICD-10-CM | POA: Diagnosis not present

## 2018-09-23 DIAGNOSIS — M25551 Pain in right hip: Secondary | ICD-10-CM | POA: Diagnosis not present

## 2018-09-24 DIAGNOSIS — R072 Precordial pain: Secondary | ICD-10-CM | POA: Diagnosis not present

## 2018-09-25 ENCOUNTER — Ambulatory Visit: Payer: Self-pay | Admitting: Cardiology

## 2018-09-25 NOTE — Progress Notes (Deleted)
\   Follow up visit  Subjective:   Cheryl Morse, female    DOB: June 18, 1950, 68 y.o.   MRN: [id number]   No chief complaint on file.   *** HPI  68 year old Caucasian female with hypertension, uncontrolled type II diabetes mellitus, coronary artery disease status post prior multiple PCI, history of CVA, Parkinson's disease, dementia.  *** Patient is doing well without any complaints. She has stopped taking plavix since last office visit. She denies any chest pain. shortness of breath, leg edema, orthopnea, PND, presncope, syncope, TIA. She is going to establish care with a neurologist in town for her dementia and Parkinson's disease.  ***  Patient has history of hepatitis with elevated liver enzymes in 01/2017, for which she was admitted to hospital in [place].  Right upper quadrant ultrasound was suspicious for acute hepatitis.  Prior medical history his up and from patient and her daughter. There are significant gaps and I do not have any prior medical records available. Per the daughter, patient has had multiple stents placed in the past, mostly at [facility], and [facility]. Patient last saw her previous cardiologist in [place], [place]- Dr. Tarri Abernethy, in 11/2016. Her last hospital admission was in January 29 19 at [facility] with an episode of seizure.  Patient has limited baseline functional capacity. She is able to walk to the bathroom, and dress herself. She rarely goes out to [place] with her daughter. She is able to walk at [facility] without any difficulty. She does report occasional leg swelling and dyspnea on exertion on walking to the bathroom. She has just establish primary care with Dr. Jackson Latino. She does not have a neurologist.  I personally reviewed all medication's as per PCP's note, and list patient has with her from [place].  *** Past Medical History:  Diagnosis Date  . Cerebrovascular disease    . Coronary artery disease   . Dementia ([place])   . Hyperlipidemia   . Parkinson disease ([place])     *** Past Surgical History:  Procedure Laterality Date  . APPENDECTOMY    . EXCISION MASS HEAD    . EXPLORATORY LAPAROTOMY    . THYROIDECTOMY    . TONSILLECTOMY    . TUBAL LIGATION      *** Social History   Socioeconomic History  . Marital status: Widowed    Spouse name: Not on file  . Number of children: Not on file  . Years of education: Not on file  . Highest education level: Not on file  Occupational History  . Not on file  Social Needs  . Financial resource strain: Not on file  . Food insecurity:    Worry: Not on file    Inability: Not on file  . Transportation needs:    Medical: Not on file    Non-medical: Not on file  Tobacco Use  . Smoking status: Former Smoker    Packs/day: 1.00    Years: 60.00    Pack years: 60.00    Types: Cigarettes    Last attempt to quit: 09/09/2008    Years since quitting: 10.0  . Smokeless tobacco: Never Used  Substance and Sexual Activity  . Alcohol use: Not on file  . Drug use: Not on file  . Sexual activity: Not on file  Lifestyle  . Physical activity:    Days per week: Not on file    Minutes per session: Not on file  . Stress: Not on file  Relationships  . Social connections:    Talks on phone: Not on file    Gets together: Not on file    Attends religious service: Not on file    Active member of club or organization: Not on file    Attends meetings of clubs or organizations: Not on file    Relationship status: Not on file  . Intimate partner violence:    Fear of current or ex partner: Not on file    Emotionally abused: Not on file    Physically abused: Not on file    Forced sexual activity: Not on file  Other Topics Concern  . Not on file  Social History Narrative  . Not on file    *** Family History  Problem Relation Age of Onset  . COPD Mother   . Heart attack Father     *** Current Outpatient  Medications on File Prior to Visit  Medication Sig Dispense Refill  . amLODipine-benazepril (LOTREL) 5-10 MG capsule Take 1 capsule by mouth daily.    Marland Kitchen aspirin EC 81 MG tablet Take 81 mg by mouth daily.    . busPIRone (BUSPAR) 30 MG tablet Take 30 mg by mouth 2 (two) times daily.    . calcitRIOL (ROCALTROL) 0.25 MCG capsule Take 0.25 mcg by mouth daily.    . carbidopa-levodopa (PARCOPA) 25-100 MG disintegrating tablet Take 1 tablet by mouth 3 (three) times daily.    . carvedilol (COREG) 6.25 MG tablet Take 6.25 mg by mouth 2 (two) times daily with a meal.    . Cholecalciferol (CVS D3) 50 MCG (2000 UT) CAPS Take by mouth.    . clopidogrel (PLAVIX) 75 MG tablet Take 75 mg by mouth daily.    . empagliflozin (JARDIANCE) 10 MG TABS tablet Take 10 mg by mouth daily.    . furosemide (LASIX) 20 MG tablet Take 20 mg by mouth.    Marland Kitchen glipiZIDE (GLUCOTROL) 5 MG tablet Take by mouth daily before breakfast.    . insulin aspart (NOVOLOG FLEXPEN) 100 UNIT/ML FlexPen Inject into the skin 3 (three) times daily with meals. Sliding scale    . Insulin Degludec (TRESIBA) 100 UNIT/ML SOLN Inject 28 Units into the skin daily.    Marland Kitchen levothyroxine (SYNTHROID) 100 MCG tablet Take 100 mcg by mouth daily before breakfast.    . nitroGLYCERIN (NITROSTAT) 0.4 MG SL tablet Place 0.4 mg under the tongue every 5 (five) minutes as needed for chest pain.    . Probiotic Product (PRO-BIOTIC BLEND) CAPS Take by mouth.    . rosuvastatin (CRESTOR) 40 MG tablet Take 40 mg by mouth daily.    . traMADol (ULTRAM) 50 MG tablet Take by mouth every 6 (six) hours as needed.    . traZODone (DESYREL) 150 MG tablet Take 375 mg by mouth at bedtime.    Marland Kitchen venlafaxine XR (EFFEXOR-XR) 150 MG 24 hr capsule Take 300 mg by mouth daily with breakfast.     No current facility-administered medications on file prior to visit.     Cardiovascular studies:  ***  *** Recent labs: 04/13/2017: Chol 259, HDL 44, TG 362, LDL 159 H/H 15.5/40.4/MCV  88.4/Platelets 235 Glucose 577. BUN/Cr 16/1.52, eGFR 36, Na 135, K 4.3 TSH 94 (0.4-4.5) HbA1C 11.2  08/14/2016: H/H 11/33.  MCV 91.  Platelets 123 Glucose 168.  BUN/creatinine 17/1.3.  EGFR 43.  Sodium 143, potassium 4.8   ROS      *** There were no vitals filed for this visit.  Objective:  Physical Exam        Assessment & Recommendations:   ***  ***   Sindi Beckworth Esther Hardy, MD [facility] Pager: [phone] Office: [phone] If no answer Cell [phone]

## 2018-09-29 DIAGNOSIS — R55 Syncope and collapse: Secondary | ICD-10-CM | POA: Diagnosis not present

## 2018-09-29 DIAGNOSIS — I491 Atrial premature depolarization: Secondary | ICD-10-CM | POA: Diagnosis not present

## 2018-09-29 DIAGNOSIS — R072 Precordial pain: Secondary | ICD-10-CM | POA: Diagnosis not present

## 2018-09-29 DIAGNOSIS — E1165 Type 2 diabetes mellitus with hyperglycemia: Secondary | ICD-10-CM | POA: Diagnosis not present

## 2018-09-29 DIAGNOSIS — R404 Transient alteration of awareness: Secondary | ICD-10-CM | POA: Diagnosis not present

## 2018-09-29 DIAGNOSIS — R079 Chest pain, unspecified: Secondary | ICD-10-CM | POA: Diagnosis not present

## 2018-09-30 DIAGNOSIS — R072 Precordial pain: Secondary | ICD-10-CM | POA: Diagnosis not present

## 2018-09-30 DIAGNOSIS — R55 Syncope and collapse: Secondary | ICD-10-CM | POA: Diagnosis not present

## 2018-10-07 DIAGNOSIS — E039 Hypothyroidism, unspecified: Secondary | ICD-10-CM | POA: Diagnosis not present

## 2018-10-07 DIAGNOSIS — F3341 Major depressive disorder, recurrent, in partial remission: Secondary | ICD-10-CM | POA: Diagnosis not present

## 2018-10-07 DIAGNOSIS — G4700 Insomnia, unspecified: Secondary | ICD-10-CM | POA: Diagnosis not present

## 2018-10-07 DIAGNOSIS — I119 Hypertensive heart disease without heart failure: Secondary | ICD-10-CM | POA: Diagnosis not present

## 2018-10-07 DIAGNOSIS — E1165 Type 2 diabetes mellitus with hyperglycemia: Secondary | ICD-10-CM | POA: Diagnosis not present

## 2018-10-07 DIAGNOSIS — F0280 Dementia in other diseases classified elsewhere without behavioral disturbance: Secondary | ICD-10-CM | POA: Diagnosis not present

## 2018-10-07 DIAGNOSIS — E785 Hyperlipidemia, unspecified: Secondary | ICD-10-CM | POA: Diagnosis not present

## 2018-10-07 DIAGNOSIS — Z8673 Personal history of transient ischemic attack (TIA), and cerebral infarction without residual deficits: Secondary | ICD-10-CM | POA: Diagnosis not present

## 2018-10-07 DIAGNOSIS — I10 Essential (primary) hypertension: Secondary | ICD-10-CM | POA: Diagnosis not present

## 2018-10-07 DIAGNOSIS — G8191 Hemiplegia, unspecified affecting right dominant side: Secondary | ICD-10-CM | POA: Diagnosis not present

## 2018-10-16 DIAGNOSIS — I10 Essential (primary) hypertension: Secondary | ICD-10-CM | POA: Diagnosis not present

## 2018-10-16 DIAGNOSIS — Z658 Other specified problems related to psychosocial circumstances: Secondary | ICD-10-CM | POA: Diagnosis not present

## 2018-10-16 DIAGNOSIS — R569 Unspecified convulsions: Secondary | ICD-10-CM | POA: Diagnosis not present

## 2018-10-16 DIAGNOSIS — G40909 Epilepsy, unspecified, not intractable, without status epilepticus: Secondary | ICD-10-CM | POA: Diagnosis not present

## 2018-10-16 DIAGNOSIS — E1165 Type 2 diabetes mellitus with hyperglycemia: Secondary | ICD-10-CM | POA: Diagnosis not present

## 2018-11-14 ENCOUNTER — Ambulatory Visit: Payer: Self-pay | Admitting: Cardiology

## 2018-11-14 NOTE — Progress Notes (Deleted)
Virtual Visit via Video Note   Subjective:   Cheryl Morse, female    DOB: 09/13/1950, 68 y.o.   MRN: [id number]   I connected with the patient on ***11/14/18 by a video enabled telemedicine application and verified that I am speaking with the correct person using two identifiers.     I discussed the limitations of evaluation and management by telemedicine and the availability of in person appointments. The patient expressed understanding and agreed to proceed.   This visit type was conducted due to national recommendations for restrictions regarding the COVID-19 Pandemic (e.g. social distancing).  This format is felt to be most appropriate for this patient at this time.  All issues noted in this document were discussed and addressed.  No physical exam was performed (except for noted visual exam findings with Tele health visits).  The patient has consented to conduct a Tele health visit and understands insurance will be billed.     Chief complaint:  ***  *** HPI  68 y.o. *** female with ***  *** Past Medical History:  Diagnosis Date  . Cerebrovascular disease   . Coronary artery disease   . Dementia ([place])   . Hyperlipidemia   . Parkinson disease ([place])     *** Past Surgical History:  Procedure Laterality Date  . APPENDECTOMY    . EXCISION MASS HEAD    . EXPLORATORY LAPAROTOMY    . THYROIDECTOMY    . TONSILLECTOMY    . TUBAL LIGATION      *** Social History   Socioeconomic History  . Marital status: Widowed    Spouse name: Not on file  . Number of children: Not on file  . Years of education: Not on file  . Highest education level: Not on file  Occupational History  . Not on file  Social Needs  . Financial resource strain: Not on file  . Food insecurity    Worry: Not on file    Inability: Not on file  . Transportation needs    Medical: Not on file    Non-medical: Not on file  Tobacco Use  . Smoking status: Former Smoker    Packs/day: 1.00    Years:  60.00    Pack years: 60.00    Types: Cigarettes    Quit date: 09/09/2008    Years since quitting: 10.1  . Smokeless tobacco: Never Used  Substance and Sexual Activity  . Alcohol use: Not on file  . Drug use: Not on file  . Sexual activity: Not on file  Lifestyle  . Physical activity    Days per week: Not on file    Minutes per session: Not on file  . Stress: Not on file  Relationships  . Social Herbalist on phone: Not on file    Gets together: Not on file    Attends religious service: Not on file    Active member of club or organization: Not on file    Attends meetings of clubs or organizations: Not on file    Relationship status: Not on file  . Intimate partner violence    Fear of current or ex partner: Not on file    Emotionally abused: Not on file    Physically abused: Not on file    Forced sexual activity: Not on file  Other Topics Concern  . Not on file  Social History Narrative  . Not on file    *** Family History  Problem Relation Age  of Onset  . COPD Mother   . Heart attack Father     *** Current Outpatient Medications on File Prior to Visit  Medication Sig Dispense Refill  . amLODipine-benazepril (LOTREL) 5-10 MG capsule Take 1 capsule by mouth daily.    Marland Kitchen aspirin EC 81 MG tablet Take 81 mg by mouth daily.    . busPIRone (BUSPAR) 30 MG tablet Take 30 mg by mouth 2 (two) times daily.    . calcitRIOL (ROCALTROL) 0.25 MCG capsule Take 0.25 mcg by mouth daily.    . carbidopa-levodopa (PARCOPA) 25-100 MG disintegrating tablet Take 1 tablet by mouth 3 (three) times daily.    . carvedilol (COREG) 6.25 MG tablet Take 6.25 mg by mouth 2 (two) times daily with a meal.    . Cholecalciferol (CVS D3) 50 MCG (2000 UT) CAPS Take by mouth.    . clopidogrel (PLAVIX) 75 MG tablet Take 75 mg by mouth daily.    . empagliflozin (JARDIANCE) 10 MG TABS tablet Take 10 mg by mouth daily.    . furosemide (LASIX) 20 MG tablet Take 20 mg by mouth.    Marland Kitchen glipiZIDE  (GLUCOTROL) 5 MG tablet Take by mouth daily before breakfast.    . insulin aspart (NOVOLOG FLEXPEN) 100 UNIT/ML FlexPen Inject into the skin 3 (three) times daily with meals. Sliding scale    . Insulin Degludec (TRESIBA) 100 UNIT/ML SOLN Inject 28 Units into the skin daily.    Marland Kitchen levothyroxine (SYNTHROID) 100 MCG tablet Take 100 mcg by mouth daily before breakfast.    . nitroGLYCERIN (NITROSTAT) 0.4 MG SL tablet Place 0.4 mg under the tongue every 5 (five) minutes as needed for chest pain.    . Probiotic Product (PRO-BIOTIC BLEND) CAPS Take by mouth.    . rosuvastatin (CRESTOR) 40 MG tablet Take 40 mg by mouth daily.    . traMADol (ULTRAM) 50 MG tablet Take by mouth every 6 (six) hours as needed.    . traZODone (DESYREL) 150 MG tablet Take 375 mg by mouth at bedtime.    Marland Kitchen venlafaxine XR (EFFEXOR-XR) 150 MG 24 hr capsule Take 300 mg by mouth daily with breakfast.     No current facility-administered medications on file prior to visit.     Cardiovascular studies:  ***  *** Recent labs: ***  *** ROS      *** There were no vitals filed for this visit. (Measured by the patient using a home BP monitor)  There is no height or weight on file to calculate BMI. There were no vitals filed for this visit.  *** Observation/findings during video visit   Objective:    Physical Exam        Assessment & Recommendations:   68 year old Caucasian female with hypertension, uncontrolled type II diabetes mellitus, coronary artery disease status post prior multiple PCI, history of CVA, Parkinson's disease, dementia.  *** Stable from cardiac standoint. No s/s heart failure. Continue current medical therapy. I will see her back in one year.  ***   [place], MD [facility] Pager: [phone] Office: [phone] If no answer Cell [phone]

## 2018-12-09 DIAGNOSIS — R456 Violent behavior: Secondary | ICD-10-CM | POA: Diagnosis not present

## 2018-12-09 DIAGNOSIS — M11262 Other chondrocalcinosis, left knee: Secondary | ICD-10-CM | POA: Diagnosis not present

## 2018-12-09 DIAGNOSIS — Z20828 Contact with and (suspected) exposure to other viral communicable diseases: Secondary | ICD-10-CM | POA: Diagnosis not present

## 2018-12-09 DIAGNOSIS — M25752 Osteophyte, left hip: Secondary | ICD-10-CM | POA: Diagnosis not present

## 2018-12-09 DIAGNOSIS — E1165 Type 2 diabetes mellitus with hyperglycemia: Secondary | ICD-10-CM | POA: Diagnosis not present

## 2018-12-09 DIAGNOSIS — I10 Essential (primary) hypertension: Secondary | ICD-10-CM | POA: Diagnosis not present

## 2018-12-09 DIAGNOSIS — Z1159 Encounter for screening for other viral diseases: Secondary | ICD-10-CM | POA: Diagnosis not present

## 2018-12-09 DIAGNOSIS — M25562 Pain in left knee: Secondary | ICD-10-CM | POA: Diagnosis not present

## 2018-12-09 DIAGNOSIS — R300 Dysuria: Secondary | ICD-10-CM | POA: Diagnosis not present

## 2018-12-09 DIAGNOSIS — F332 Major depressive disorder, recurrent severe without psychotic features: Secondary | ICD-10-CM | POA: Diagnosis not present

## 2018-12-09 DIAGNOSIS — F329 Major depressive disorder, single episode, unspecified: Secondary | ICD-10-CM | POA: Diagnosis not present

## 2018-12-09 DIAGNOSIS — R52 Pain, unspecified: Secondary | ICD-10-CM | POA: Diagnosis not present

## 2018-12-09 DIAGNOSIS — F29 Unspecified psychosis not due to a substance or known physiological condition: Secondary | ICD-10-CM | POA: Diagnosis not present

## 2018-12-09 DIAGNOSIS — M25862 Other specified joint disorders, left knee: Secondary | ICD-10-CM | POA: Diagnosis not present

## 2018-12-09 DIAGNOSIS — R45851 Suicidal ideations: Secondary | ICD-10-CM | POA: Diagnosis not present

## 2018-12-09 DIAGNOSIS — M25751 Osteophyte, right hip: Secondary | ICD-10-CM | POA: Diagnosis not present

## 2018-12-10 DIAGNOSIS — M25752 Osteophyte, left hip: Secondary | ICD-10-CM | POA: Diagnosis not present

## 2018-12-10 DIAGNOSIS — M11262 Other chondrocalcinosis, left knee: Secondary | ICD-10-CM | POA: Diagnosis not present

## 2018-12-10 DIAGNOSIS — M25862 Other specified joint disorders, left knee: Secondary | ICD-10-CM | POA: Diagnosis not present

## 2018-12-10 DIAGNOSIS — F329 Major depressive disorder, single episode, unspecified: Secondary | ICD-10-CM | POA: Diagnosis not present

## 2018-12-31 DIAGNOSIS — R52 Pain, unspecified: Secondary | ICD-10-CM | POA: Diagnosis not present

## 2018-12-31 DIAGNOSIS — E1165 Type 2 diabetes mellitus with hyperglycemia: Secondary | ICD-10-CM | POA: Diagnosis not present

## 2018-12-31 DIAGNOSIS — W19XXXA Unspecified fall, initial encounter: Secondary | ICD-10-CM | POA: Diagnosis not present

## 2018-12-31 DIAGNOSIS — Z87891 Personal history of nicotine dependence: Secondary | ICD-10-CM | POA: Diagnosis not present

## 2018-12-31 DIAGNOSIS — I10 Essential (primary) hypertension: Secondary | ICD-10-CM | POA: Diagnosis not present

## 2018-12-31 DIAGNOSIS — W01198A Fall on same level from slipping, tripping and stumbling with subsequent striking against other object, initial encounter: Secondary | ICD-10-CM | POA: Diagnosis not present

## 2018-12-31 DIAGNOSIS — M542 Cervicalgia: Secondary | ICD-10-CM | POA: Diagnosis not present

## 2018-12-31 DIAGNOSIS — R51 Headache: Secondary | ICD-10-CM | POA: Diagnosis not present

## 2018-12-31 DIAGNOSIS — Y9301 Activity, walking, marching and hiking: Secondary | ICD-10-CM | POA: Diagnosis not present

## 2018-12-31 DIAGNOSIS — Y998 Other external cause status: Secondary | ICD-10-CM | POA: Diagnosis not present

## 2019-01-18 DIAGNOSIS — R509 Fever, unspecified: Secondary | ICD-10-CM | POA: Diagnosis not present

## 2019-01-18 DIAGNOSIS — R531 Weakness: Secondary | ICD-10-CM | POA: Diagnosis not present

## 2019-01-18 DIAGNOSIS — R112 Nausea with vomiting, unspecified: Secondary | ICD-10-CM | POA: Diagnosis not present

## 2019-01-18 DIAGNOSIS — M791 Myalgia, unspecified site: Secondary | ICD-10-CM | POA: Diagnosis not present

## 2019-01-18 DIAGNOSIS — R430 Anosmia: Secondary | ICD-10-CM | POA: Diagnosis not present

## 2019-01-18 DIAGNOSIS — E1165 Type 2 diabetes mellitus with hyperglycemia: Secondary | ICD-10-CM | POA: Diagnosis not present

## 2019-01-20 DIAGNOSIS — I493 Ventricular premature depolarization: Secondary | ICD-10-CM | POA: Diagnosis not present

## 2019-01-21 DIAGNOSIS — N183 Chronic kidney disease, stage 3 (moderate): Secondary | ICD-10-CM | POA: Diagnosis not present

## 2019-01-21 DIAGNOSIS — E039 Hypothyroidism, unspecified: Secondary | ICD-10-CM | POA: Diagnosis not present

## 2019-02-17 DIAGNOSIS — E1165 Type 2 diabetes mellitus with hyperglycemia: Secondary | ICD-10-CM | POA: Diagnosis not present

## 2019-02-17 DIAGNOSIS — R531 Weakness: Secondary | ICD-10-CM | POA: Diagnosis not present

## 2019-02-17 DIAGNOSIS — G4089 Other seizures: Secondary | ICD-10-CM | POA: Diagnosis not present

## 2019-02-17 DIAGNOSIS — R569 Unspecified convulsions: Secondary | ICD-10-CM | POA: Diagnosis not present

## 2020-03-10 ENCOUNTER — Encounter (HOSPITAL_COMMUNITY): Payer: Self-pay

## 2020-03-10 ENCOUNTER — Emergency Department (HOSPITAL_COMMUNITY)
Admission: EM | Admit: 2020-03-10 | Discharge: 2020-03-11 | Disposition: A | Discharge status: Home / Self Care | Payer: Medicare Other | Attending: Emergency Medicine | Admitting: Emergency Medicine

## 2020-03-10 ENCOUNTER — Other Ambulatory Visit: Payer: Self-pay

## 2020-03-10 DIAGNOSIS — R55 Syncope and collapse: Secondary | ICD-10-CM | POA: Diagnosis present

## 2020-03-10 DIAGNOSIS — Z87891 Personal history of nicotine dependence: Secondary | ICD-10-CM | POA: Insufficient documentation

## 2020-03-10 DIAGNOSIS — Z794 Long term (current) use of insulin: Secondary | ICD-10-CM | POA: Insufficient documentation

## 2020-03-10 DIAGNOSIS — Z79899 Other long term (current) drug therapy: Secondary | ICD-10-CM | POA: Diagnosis not present

## 2020-03-10 DIAGNOSIS — G20 Parkinson's disease: Secondary | ICD-10-CM | POA: Insufficient documentation

## 2020-03-10 DIAGNOSIS — I2510 Atherosclerotic heart disease of native coronary artery without angina pectoris: Secondary | ICD-10-CM | POA: Insufficient documentation

## 2020-03-10 DIAGNOSIS — Z7982 Long term (current) use of aspirin: Secondary | ICD-10-CM | POA: Diagnosis not present

## 2020-03-10 DIAGNOSIS — F0390 Unspecified dementia without behavioral disturbance: Secondary | ICD-10-CM | POA: Diagnosis not present

## 2020-03-10 NOTE — ED Notes (Addendum)
Pt ambulated in hallway with walker to the restroom successfully and back to room.

## 2020-03-10 NOTE — Discharge Instructions (Addendum)
It is not clear what caused you to have decreased responsiveness.  Since you have had this problem before, and not being diagnosed with possible seizures, we recommend that you discuss this with a neurologist.  Please call the listed neurologist for follow-up appointment as soon as possible.  Continue taking your usual medications.

## 2020-03-10 NOTE — ED Notes (Signed)
Discharge  eport given to Tomma Lightning, Therapist, sports at [facility]. They also reported there is not transported and [facility] will be needed. [facility] called.

## 2020-03-10 NOTE — ED Triage Notes (Signed)
EMS reports from [facility], Possible syncopal episode, sitting at table with other residents, Pt appeared to lose consciousness for a moment. Pt arrives ambulatory and A&Ox4  BP 129/73 HR 97 RR 18 Sp02 96 RA CBG 290

## 2020-03-10 NOTE — ED Provider Notes (Signed)
[place] DEPT Provider Note   CSN: [id number] Arrival date & time: 03/10/20  1545     History Chief Complaint  Patient presents with  . Near Syncope    Cheryl Morse is a 69 y.o. female.  HPI She presents for evaluation of an episode of near syncope or syncope, while sitting at a table with other members of her assisted care facility.  They report that she may have had 60 seconds of unconsciousness.  The patient states "I had a seizure."  She states she has had seizures before, and was on medicine, but "my daughter took me off it."  She is in a assisted living facility, where she gets care from a new physician, was previously managed as an outpatient at a local clinic.  She denies recent fever, chills, cough, weakness or dizziness.  She gives good history.  There are no other known modifying factors.    Past Medical History:  Diagnosis Date  . Cerebrovascular disease   . Coronary artery disease   . Dementia ([place])   . Hyperlipidemia   . Parkinson disease [facility])     Patient Active Problem List   Diagnosis Date Noted  . Dementia ([place])   . Parkinson disease ([place])   . Cerebrovascular disease     Past Surgical History:  Procedure Laterality Date  . APPENDECTOMY    . EXCISION MASS HEAD    . EXPLORATORY LAPAROTOMY    . THYROIDECTOMY    . TONSILLECTOMY    . TUBAL LIGATION       OB History   No obstetric history on file.     Family History  Problem Relation Age of Onset  . COPD Mother   . Heart attack Father     Social History   Tobacco Use  . Smoking status: Former Smoker    Packs/day: 1.00    Years: 60.00    Pack years: 60.00    Types: Cigarettes    Quit date: 09/09/2008    Years since quitting: 11.5  . Smokeless tobacco: Never Used  Substance Use Topics  . Alcohol use: Not on file  . Drug use: Not on file    Home Medications Prior to Admission medications   Medication Sig Start Date End Date Taking? Authorizing  Provider  amLODipine-benazepril (LOTREL) 5-10 MG capsule Take 1 capsule by mouth daily.    [provider]  aspirin EC 81 MG tablet Take 81 mg by mouth daily.    [provider]  busPIRone (BUSPAR) 30 MG tablet Take 30 mg by mouth 2 (two) times daily.    [provider]  calcitRIOL (ROCALTROL) 0.25 MCG capsule Take 0.25 mcg by mouth daily.    [provider]  carbidopa-levodopa (PARCOPA) 25-100 MG disintegrating tablet Take 1 tablet by mouth 3 (three) times daily.    [provider]  carvedilol (COREG) 6.25 MG tablet Take 6.25 mg by mouth 2 (two) times daily with a meal.    [provider]  Cholecalciferol (CVS D3) 50 MCG (2000 UT) CAPS Take by mouth.    [provider]  clopidogrel (PLAVIX) 75 MG tablet Take 75 mg by mouth daily.    [provider]  empagliflozin (JARDIANCE) 10 MG TABS tablet Take 10 mg by mouth daily.    [provider]  furosemide (LASIX) 20 MG tablet Take 20 mg by mouth.    [provider]  glipiZIDE (GLUCOTROL) 5 MG tablet Take by mouth daily before breakfast.  [provider]  insulin aspart (NOVOLOG FLEXPEN) 100 UNIT/ML FlexPen Inject into the skin 3 (three) times daily with meals. Sliding scale    [provider]  Insulin Degludec (TRESIBA) 100 UNIT/ML SOLN Inject 28 Units into the skin daily.    [provider]  levothyroxine (SYNTHROID) 100 MCG tablet Take 100 mcg by mouth daily before breakfast.    [provider]  nitroGLYCERIN (NITROSTAT) 0.4 MG SL tablet Place 0.4 mg under the tongue every 5 (five) minutes as needed for chest pain.    [provider]  Probiotic Product (PRO-BIOTIC BLEND) CAPS Take by mouth.    [provider]  rosuvastatin (CRESTOR) 40 MG tablet Take 40 mg by mouth daily.    [provider]  traMADol (ULTRAM) 50 MG tablet Take by mouth every 6 (six) hours as needed.    [provider]    traZODone (DESYREL) 150 MG tablet Take 375 mg by mouth at bedtime.    [provider]  venlafaxine XR (EFFEXOR-XR) 150 MG 24 hr capsule Take 300 mg by mouth daily with breakfast.    [provider]    Allergies    Neosporin original [bacitracin-neomycin-polymyxin]  Review of Systems   Review of Systems  All other systems reviewed and are negative.   Physical Exam Updated Vital Signs BP (!) 165/77   Pulse 84   Temp 98.1 F (36.7 C) (Oral)   Resp 16   SpO2 100%   Physical Exam Vitals and nursing note reviewed.  Constitutional:      General: She is not in acute distress.    Appearance: She is well-developed. She is not ill-appearing, toxic-appearing or diaphoretic.  HENT:     Head: Normocephalic and atraumatic.     Right Ear: External ear normal.     Left Ear: External ear normal.     Nose: Nose normal.     Mouth/Throat:     Mouth: Mucous membranes are moist.     Pharynx: No oropharyngeal exudate.     Comments: No injury to lips or tongue. Eyes:     Conjunctiva/sclera: Conjunctivae normal.     Pupils: Pupils are equal, round, and reactive to light.  Neck:     Trachea: Phonation normal.  Cardiovascular:     Rate and Rhythm: Normal rate and regular rhythm.     Heart sounds: Normal heart sounds.  Pulmonary:     Effort: Pulmonary effort is normal. No respiratory distress.     Breath sounds: Normal breath sounds. No stridor.  Abdominal:     General: There is no distension.     Palpations: Abdomen is soft.     Tenderness: There is no abdominal tenderness.  Musculoskeletal:        General: Normal range of motion.     Cervical back: Normal range of motion and neck supple.  Skin:    General: Skin is warm and dry.  Neurological:     Mental Status: She is alert and oriented to person, place, and time.     Cranial Nerves: No cranial nerve deficit.     Sensory: No sensory deficit.     Motor: No abnormal muscle tone.     Coordination: Coordination  normal.  Psychiatric:        Mood and Affect: Mood normal.        Behavior: Behavior normal.     ED Results / Procedures / Treatments   Labs (all labs ordered are listed, but only abnormal  results are displayed) Labs Reviewed - No data to display  EKG None  Radiology No results found.  Procedures Procedures (including critical care time)  Medications Ordered in ED Medications - No data to display  ED Course  I have reviewed the triage vital signs and the nursing notes.  Pertinent labs & imaging results that were available during my care of the patient were reviewed by me and considered in my medical decision making (see chart for details).    MDM Rules/Calculators/A&P                           Patient Vitals for the past 24 hrs:  BP Temp Temp src Pulse Resp SpO2  03/10/20 1602 (!) 165/77 98.1 F (36.7 C) Oral 84 16 100 %    8:45 PM reevaluation with update and discussion. After initial assessment and treatment, an updated evaluation reveals clinical exam unchanged during period of observation.  No seizures.  She is ambulating as per normal with her walker. Daleen Bo   Medical Decision Making:  This patient is presenting for evaluation of transient decreased responsiveness, which does require a range of treatment options, and is a complaint that involves a moderate risk of morbidity and mortality. The differential diagnoses include syncope, near syncope, acute illness, vasovagal episode. I decided to review old records, and in summary elderly female, lives in assisted living facility and had a period of decreased responsiveness while sitting on the table.  She reports a seizure history but there is none documented, on the paperwork with her from her facility.      Critical Interventions-clinical evaluation, EKG, orthostatic blood pressure and pulse, observation reassessment  After These Interventions, the Patient was reevaluated and was found with transient  decreased responsiveness, near syncope versus syncope versus vasovagal episode.  Review of care everywhere indicates that she has a history of seizure, noted on 03/26/2017.  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  This was based on her presentation and reported prior histories.  CRITICAL CARE-no Performed by: Daleen Bo  Nursing Notes Reviewed/ Care Coordinated Applicable Imaging Reviewed Interpretation of Laboratory Data incorporated into ED treatment  The patient appears reasonably screened and/or stabilized for discharge and I doubt any other medical condition or other [facility] requiring further screening, evaluation, or treatment in the ED at this time prior to discharge.  Plan: Home Medications-continue usual; Home Treatments-gradual advance diet and activity; return here if the recommended treatment, does not improve the symptoms; Recommended follow up-neurology for definitive evaluation of periods of altered mental status, possibly seizure related.     Final Clinical Impression(s) / ED Diagnoses Final diagnoses:  None    Rx / DC Orders ED Discharge Orders    None       Daleen Bo, MD 03/11/20 1142

## 2020-03-25 ENCOUNTER — Emergency Department (HOSPITAL_COMMUNITY): Payer: Medicare Other

## 2020-03-25 ENCOUNTER — Other Ambulatory Visit: Payer: Self-pay

## 2020-03-25 ENCOUNTER — Encounter (HOSPITAL_COMMUNITY): Payer: Self-pay | Admitting: Emergency Medicine

## 2020-03-25 ENCOUNTER — Emergency Department (HOSPITAL_COMMUNITY)
Admission: EM | Admit: 2020-03-25 | Discharge: 2020-03-26 | Disposition: A | Discharge status: Home / Self Care | Payer: Medicare Other | Attending: Emergency Medicine | Admitting: Emergency Medicine

## 2020-03-25 DIAGNOSIS — Z7984 Long term (current) use of oral hypoglycemic drugs: Secondary | ICD-10-CM | POA: Diagnosis not present

## 2020-03-25 DIAGNOSIS — Z87891 Personal history of nicotine dependence: Secondary | ICD-10-CM | POA: Diagnosis not present

## 2020-03-25 DIAGNOSIS — R0789 Other chest pain: Secondary | ICD-10-CM | POA: Insufficient documentation

## 2020-03-25 DIAGNOSIS — F0390 Unspecified dementia without behavioral disturbance: Secondary | ICD-10-CM | POA: Diagnosis not present

## 2020-03-25 DIAGNOSIS — R079 Chest pain, unspecified: Secondary | ICD-10-CM | POA: Diagnosis present

## 2020-03-25 DIAGNOSIS — Z794 Long term (current) use of insulin: Secondary | ICD-10-CM | POA: Diagnosis not present

## 2020-03-25 DIAGNOSIS — I2510 Atherosclerotic heart disease of native coronary artery without angina pectoris: Secondary | ICD-10-CM | POA: Diagnosis not present

## 2020-03-25 DIAGNOSIS — G20 Parkinson's disease: Secondary | ICD-10-CM | POA: Insufficient documentation

## 2020-03-25 DIAGNOSIS — E119 Type 2 diabetes mellitus without complications: Secondary | ICD-10-CM | POA: Insufficient documentation

## 2020-03-25 HISTORY — DX: Type 2 diabetes mellitus without complications: E11.9

## 2020-03-25 LAB — CBC
HCT: 30.1 % — ABNORMAL LOW (ref 36.0–46.0)
Hemoglobin: 10.1 g/dL — ABNORMAL LOW (ref 12.0–15.0)
MCH: 27.2 pg (ref 26.0–34.0)
MCHC: 33.6 g/dL (ref 30.0–36.0)
MCV: 81.1 fL (ref 80.0–100.0)
Platelets: 167 10*3/uL (ref 150–400)
RBC: 3.71 MIL/uL — ABNORMAL LOW (ref 3.87–5.11)
RDW: 14.9 % (ref 11.5–15.5)
WBC: 4 10*3/uL (ref 4.0–10.5)
nRBC: 0 % (ref 0.0–0.2)

## 2020-03-25 LAB — BASIC METABOLIC PANEL
Anion gap: 13 (ref 5–15)
BUN: 14 mg/dL (ref 8–23)
CO2: 20 mmol/L — ABNORMAL LOW (ref 22–32)
Calcium: 8.8 mg/dL — ABNORMAL LOW (ref 8.9–10.3)
Chloride: 101 mmol/L (ref 98–111)
Creatinine, Ser: 1.06 mg/dL — ABNORMAL HIGH (ref 0.44–1.00)
GFR, Estimated: 57 mL/min — ABNORMAL LOW (ref >60)
Glucose, Bld: 343 mg/dL — ABNORMAL HIGH (ref 70–99)
Potassium: 3.8 mmol/L (ref 3.5–5.1)
Sodium: 134 mmol/L — ABNORMAL LOW (ref 135–145)

## 2020-03-25 LAB — TROPONIN I (HIGH SENSITIVITY)
Troponin I (High Sensitivity): 7 ng/L (ref <18)
Troponin I (High Sensitivity): 8 ng/L (ref <18)

## 2020-03-25 NOTE — ED Provider Notes (Signed)
[place] EMERGENCY DEPARTMENT Provider Note   CSN: [id number] Arrival date & time: 03/25/20  1706     History Chief Complaint  Patient presents with  . Chest Pain    Cheryl Morse is a 69 y.o. female.  The history is provided by the patient and medical records.  Chest Pain  Cheryl Morse is a 68 y.o. female who presents to the Emergency Department complaining of chest pain. She presents the emergency department by EMS complaining of left sided chest pain that started 330 today while playing bingo. She describes pain as a constant soreness that is located throughout her left chest. Pain is now completely resolved. It lasted for about an hour or two. She denies any associated fevers, shortness of breath, diaphoresis, abdominal pain, nausea, vomiting, diarrhea. She does have lower extremity edema and states that this is been worse for the last few days. She does have a history of CVA, coronary artery disease, diabetes, seizure disorder. No recent medication changes.    Past Medical History:  Diagnosis Date  . Cerebrovascular disease   . Coronary artery disease   . Dementia ([place])   . Diabetes mellitus without complication ([place])   . Hyperlipidemia   . Parkinson disease [facility])     Patient Active Problem List   Diagnosis Date Noted  . Dementia ([place])   . Parkinson disease ([place])   . Cerebrovascular disease     Past Surgical History:  Procedure Laterality Date  . APPENDECTOMY    . EXCISION MASS HEAD    . EXPLORATORY LAPAROTOMY    . THYROIDECTOMY    . TONSILLECTOMY    . TUBAL LIGATION       OB History   No obstetric history on file.     Family History  Problem Relation Age of Onset  . COPD Mother   . Heart attack Father     Social History   Tobacco Use  . Smoking status: Former Smoker    Packs/day: 1.00    Years: 60.00    Pack years: 60.00    Types: Cigarettes    Quit date: 09/09/2008    Years since quitting: 11.5  . Smokeless tobacco:  Never Used  Substance Use Topics  . Alcohol use: Never  . Drug use: Never    Home Medications Prior to Admission medications   Medication Sig Start Date End Date Taking? Authorizing Provider  ACETAMINOPHEN PO Take 500 mg by mouth in the morning, at noon, and at bedtime.    [provider]  alum & mag hydroxide-simeth (MAALOX/MYLANTA) 200-200-20 MG/5ML suspension Take 30 mLs by mouth every 6 (six) hours as needed for indigestion or heartburn.    [provider]  amLODipine (NORVASC) 10 MG tablet Take 10 mg by mouth daily.    [provider]  carbidopa-levodopa (PARCOPA) 25-100 MG disintegrating tablet Take 1 tablet by mouth 3 (three) times daily.    [provider]  clopidogrel (PLAVIX) 75 MG tablet Take 75 mg by mouth daily.    [provider]  glimepiride (AMARYL) 4 MG tablet Take 4 mg by mouth daily.    [provider]  glipiZIDE (GLUCOTROL) 5 MG tablet Take by mouth daily before breakfast.    [provider]  insulin glargine (LANTUS SOLOSTAR) 100 UNIT/ML Solostar Pen Inject 40 Units into the skin daily. 30 units in the Morning / 10 units in the evening    [provider]  Insulin NPH, Human,, Isophane, (HUMULIN N KWIKPEN)  100 UNIT/ML Kiwkpen Inject 10 Units into the skin daily as needed. 10 units SubQ PRN FSBG is >450    [provider]  levothyroxine (SYNTHROID) 175 MCG tablet Take 175 mcg by mouth daily.    [provider]  loperamide (IMODIUM) 2 MG capsule Take 2 mg by mouth as needed for diarrhea or loose stools. No more than 8 doses in 24 hours    [provider]  magnesium hydroxide (MILK OF MAGNESIA) 400 MG/5ML suspension Take 30 mLs by mouth daily as needed for mild constipation or moderate constipation.    [provider]  memantine (NAMENDA) 5 MG tablet Take 5 mg by mouth daily.    [provider]  nitroGLYCERIN (NITROSTAT) 0.4 MG SL tablet Place 0.4 mg under the  tongue every 5 (five) minutes as needed for chest pain.    [provider]  pravastatin (PRAVACHOL) 40 MG tablet Take 40 mg by mouth daily.    [provider]  traZODone (DESYREL) 150 MG tablet Take 375 mg by mouth at bedtime.    [provider]    Allergies    Nsaids, Neosporin original [bacitracin-neomycin-polymyxin], and Other  Review of Systems   Review of Systems  Cardiovascular: Positive for chest pain.  All other systems reviewed and are negative.   Physical Exam Updated Vital Signs BP (!) 175/91   Pulse 82   Temp 98.4 F (36.9 C) (Oral)   Resp 18   Ht 5' 5.5" (1.664 m)   Wt 61.2 kg   SpO2 100%   BMI 22.12 kg/m   Physical Exam Vitals and nursing note reviewed.  Constitutional:      Appearance: She is well-developed.  HENT:     Head: Normocephalic and atraumatic.  Cardiovascular:     Rate and Rhythm: Normal rate and regular rhythm.     Heart sounds: No murmur heard.   Pulmonary:     Effort: Pulmonary effort is normal. No respiratory distress.     Breath sounds: Normal breath sounds.  Chest:     Chest wall: No tenderness.  Abdominal:     Palpations: Abdomen is soft.     Tenderness: There is no abdominal tenderness. There is no guarding or rebound.  Musculoskeletal:        General: Swelling present. No tenderness.     Comments: 1 to 2+ pitting edema bilaterally  Skin:    General: Skin is warm and dry.  Neurological:     Mental Status: She is alert and oriented to person, place, and time.  Psychiatric:        Behavior: Behavior normal.     ED Results / Procedures / Treatments   Labs (all labs ordered are listed, but only abnormal results are displayed) Labs Reviewed  BASIC METABOLIC PANEL - Abnormal; Notable for the following components:      Result Value   Sodium 134 (*)    CO2 20 (*)    Glucose, Bld 343 (*)    Creatinine, Ser 1.06 (*)    Calcium 8.8 (*)    GFR, Estimated 57 (*)    All other components within normal  limits  CBC - Abnormal; Notable for the following components:   RBC 3.71 (*)    Hemoglobin 10.1 (*)    HCT 30.1 (*)    All other components within normal limits  TROPONIN I (HIGH SENSITIVITY)  TROPONIN I (HIGH SENSITIVITY)    EKG EKG Interpretation  Date/Time:  Thursday March 25 2020  17:04:49 EST Ventricular Rate:  76 PR Interval:  148 QRS Duration: 90 QT Interval:  422 QTC Calculation: 474 R Axis:   57 Text Interpretation: Normal sinus rhythm Normal ECG Confirmed by Quintella Reichert [phone]) on 03/25/2020 9:10:00 PM   Radiology DG Chest 2 View  Result Date: 03/25/2020 CLINICAL DATA:  Chest pain. EXAM: CHEST - 2 VIEW COMPARISON:  Prior chest radiographs 12/12/2019. FINDINGS: Heart size within normal limits. Coronary artery calcifications. Aortic atherosclerosis. No appreciable airspace consolidation or pulmonary edema. No evidence of pleural effusion or pneumothorax. No acute bony abnormality identified. IMPRESSION: No evidence of acute cardiopulmonary abnormality. Coronary artery calcifications. Aortic Atherosclerosis (ICD10-I70.0). Electronically Signed   By: Kellie Simmering DO   On: 03/25/2020 19:06    Procedures Procedures (including critical care time)  Medications Ordered in ED Medications - No data to display  ED Course  I have reviewed the triage vital signs and the nursing notes.  Pertinent labs & imaging results that were available during my care of the patient were reviewed by me and considered in my medical decision making (see chart for details).    MDM Rules/Calculators/A&P                         Patient here for evaluation of episode of chest pain several hours prior to ED arrival, pain resolved on ED presentation. She had no associated symptoms. EKG without acute ischemic changes. Records reviewed in care everywhere. CBC with stable anemia. BMP with stable renal insufficiency. Presentation is not consistent with ACS, PE, dissection. She is hyperglycemic, and  has been on multiple prior lab draws. Presentation is not consistent with DKA. Discussed with patient home care for atypical chest pain. Discussed outpatient follow-up and return precautions.  Final Clinical Impression(s) / ED Diagnoses Final diagnoses:  Atypical chest pain    Rx / DC Orders ED Discharge Orders    None       Quintella Reichert, MD 03/25/20 2353

## 2020-03-25 NOTE — ED Triage Notes (Signed)
Pt to ED via [facility] from [facility] with c/o upper left chest pain and left arm pain.  Onset while playing bingo. Painful with movement and touch

## 2020-03-26 NOTE — ED Notes (Signed)
[facility] called  

## 2020-03-26 NOTE — ED Notes (Signed)
Report given to [facility] phone number [phone]

## 2020-04-27 ENCOUNTER — Emergency Department (HOSPITAL_COMMUNITY)
Admission: EM | Admit: 2020-04-27 | Discharge: 2020-04-28 | Disposition: A | Discharge status: Home / Self Care | Payer: Medicare Other | Attending: Emergency Medicine | Admitting: Emergency Medicine

## 2020-04-27 ENCOUNTER — Other Ambulatory Visit: Payer: Self-pay

## 2020-04-27 ENCOUNTER — Emergency Department (HOSPITAL_COMMUNITY): Payer: Medicare Other

## 2020-04-27 DIAGNOSIS — I2510 Atherosclerotic heart disease of native coronary artery without angina pectoris: Secondary | ICD-10-CM | POA: Insufficient documentation

## 2020-04-27 DIAGNOSIS — F0390 Unspecified dementia without behavioral disturbance: Secondary | ICD-10-CM | POA: Insufficient documentation

## 2020-04-27 DIAGNOSIS — Z794 Long term (current) use of insulin: Secondary | ICD-10-CM | POA: Insufficient documentation

## 2020-04-27 DIAGNOSIS — R1013 Epigastric pain: Secondary | ICD-10-CM | POA: Diagnosis not present

## 2020-04-27 DIAGNOSIS — E119 Type 2 diabetes mellitus without complications: Secondary | ICD-10-CM | POA: Insufficient documentation

## 2020-04-27 DIAGNOSIS — R0789 Other chest pain: Secondary | ICD-10-CM | POA: Insufficient documentation

## 2020-04-27 DIAGNOSIS — Z87891 Personal history of nicotine dependence: Secondary | ICD-10-CM | POA: Insufficient documentation

## 2020-04-27 LAB — TROPONIN I (HIGH SENSITIVITY)
Troponin I (High Sensitivity): 8 ng/L (ref <18)
Troponin I (High Sensitivity): 11 ng/L (ref <18)

## 2020-04-27 LAB — CBC
HCT: 32.1 % — ABNORMAL LOW (ref 36.0–46.0)
Hemoglobin: 10.3 g/dL — ABNORMAL LOW (ref 12.0–15.0)
MCH: 25.8 pg — ABNORMAL LOW (ref 26.0–34.0)
MCHC: 32.1 g/dL (ref 30.0–36.0)
MCV: 80.3 fL (ref 80.0–100.0)
Platelets: 241 10*3/uL (ref 150–400)
RBC: 4 MIL/uL (ref 3.87–5.11)
RDW: 14.3 % (ref 11.5–15.5)
WBC: 5.3 10*3/uL (ref 4.0–10.5)
nRBC: 0 % (ref 0.0–0.2)

## 2020-04-27 LAB — BASIC METABOLIC PANEL
Anion gap: 10 (ref 5–15)
BUN: 13 mg/dL (ref 8–23)
CO2: 25 mmol/L (ref 22–32)
Calcium: 9 mg/dL (ref 8.9–10.3)
Chloride: 100 mmol/L (ref 98–111)
Creatinine, Ser: 1 mg/dL (ref 0.44–1.00)
GFR, Estimated: >60 mL/min (ref >60)
Glucose, Bld: 298 mg/dL — ABNORMAL HIGH (ref 70–99)
Potassium: 3.5 mmol/L (ref 3.5–5.1)
Sodium: 135 mmol/L (ref 135–145)

## 2020-04-27 NOTE — ED Triage Notes (Signed)
To ED via [facility] from [facility] with c/o chest pain- received NTG x 3, ASA 324mg --pain free at present-- pain was relieved after belching--  18G in left AC- CBG -- for EMS==430,

## 2020-04-28 DIAGNOSIS — R0789 Other chest pain: Secondary | ICD-10-CM | POA: Diagnosis not present

## 2020-04-28 MED ORDER — ALUM & MAG HYDROXIDE-SIMETH 200-200-20 MG/5ML PO SUSP
30.0000 mL | Freq: Once | ORAL | Status: AC
  Administered 2020-04-28: 30 mL via ORAL
  Filled 2020-04-28: qty 30

## 2020-04-28 MED ORDER — LIDOCAINE VISCOUS HCL 2 % MT SOLN
15.0000 mL | Freq: Once | OROMUCOSAL | Status: AC
  Administered 2020-04-28: 15 mL via ORAL
  Filled 2020-04-28: qty 15

## 2020-04-28 MED ORDER — AMLODIPINE BESYLATE 5 MG PO TABS
10.0000 mg | ORAL_TABLET | Freq: Once | ORAL | Status: AC
  Administered 2020-04-28: 10 mg via ORAL
  Filled 2020-04-28: qty 2

## 2020-04-28 NOTE — Discharge Instructions (Addendum)
Thank you for allowing me to care for you today in the Emergency Department.   Your chest pain work-up in the ER today was reassuring.  Please follow-up with cardiology.  Their office information is listed above.  Your morning dose of amlodipine was given this morning in the emergency department.  You can take Tums if you develop similar chest pain to see if it improves her symptoms.  Return to the emergency department if you pass out, develop respiratory distress, chest pain with sweating, numbness or weakness, or other new, concerning symptoms.

## 2020-04-28 NOTE — ED Notes (Signed)
Pt ambulated to the bathroom. Having Diet Coke and graham crackers.

## 2020-04-28 NOTE — ED Notes (Signed)
Pt is A&Ox4. [facility] was called and informed of patient return. Per [employer] they don't provide transportation for patient. Ptar called and arranged.

## 2020-04-28 NOTE — ED Provider Notes (Signed)
Cheryl Morse Provider Note   CSN: [id number] Arrival date & time: 04/27/20  1404     History Chief Complaint  Patient presents with  . Abdominal Pain  . Chest Pain    Cheryl Morse is a 70 y.o. female with history of dementia, Parkinson's disease, HLD, diabetes mellitus, CAD, CVD who presents to the emergency [facility] via EMS from [place] with a chief complaint of chest pain.  The patient reports left-sided chest painand located under her left breast.  She characterizes the pain as sharp and stabbing.  Pain only lasted for a couple of minutes and began while she was eating pancakes.  It is nonradiating.  Symptoms resolved after belching. She is currently pain free.  States that she has a remote history of similar chest pain.  She denies other recent episodes of postprandial pain.  No pleuritic or exertional pain.  She denies dysphagia, choking, shortness of breath, palpitations, diaphoresis, fever, chills, urinary symptoms, leg swelling, orthopnea, vomiting, diarrhea, constipation, sore throat.   She is not currently established with a cardiologist, but reports that she was previously by cardiology at [place].   Per EMS, she was given 324 mg of ASA and nitroglycerin x3; however, patient reports that the pain was relieved after belching.    The history is provided by medical records and the patient. No language interpreter was used.       Past Medical History:  Diagnosis Date  . Cerebrovascular disease   . Coronary artery disease   . Dementia ([place])   . Diabetes mellitus without complication ([place])   . Hyperlipidemia   . Parkinson disease [facility])     Patient Active Problem List   Diagnosis Date Noted  . Dementia ([place])   . Parkinson disease ([place])   . Cerebrovascular disease     Past Surgical History:  Procedure Laterality Date  . APPENDECTOMY    . EXCISION MASS HEAD    . EXPLORATORY LAPAROTOMY    . THYROIDECTOMY    .  TONSILLECTOMY    . TUBAL LIGATION       OB History   No obstetric history on file.     Family History  Problem Relation Age of Onset  . COPD Mother   . Heart attack Father     Social History   Tobacco Use  . Smoking status: Former Smoker    Packs/day: 1.00    Years: 60.00    Pack years: 60.00    Types: Cigarettes    Quit date: 09/09/2008    Years since quitting: 11.6  . Smokeless tobacco: Never Used  Substance Use Topics  . Alcohol use: Never  . Drug use: Never    Home Medications Prior to Admission medications   Medication Sig Start Date End Date Taking? Authorizing Provider  ACETAMINOPHEN PO Take 500 mg by mouth in the morning, at noon, and at bedtime.    [provider]  alum & mag hydroxide-simeth (MAALOX/MYLANTA) 200-200-20 MG/5ML suspension Take 30 mLs by mouth every 6 (six) hours as needed for indigestion or heartburn.    [provider]  amLODipine (NORVASC) 10 MG tablet Take 10 mg by mouth daily.    [provider]  carbidopa-levodopa (PARCOPA) 25-100 MG disintegrating tablet Take 1 tablet by mouth 3 (three) times daily.    [provider]  clopidogrel (PLAVIX) 75 MG tablet Take 75 mg by mouth daily.    [provider]  glimepiride (AMARYL) 4 MG tablet Take  4 mg by mouth daily.    [provider]  glipiZIDE (GLUCOTROL) 5 MG tablet Take by mouth daily before breakfast.    [provider]  insulin glargine (LANTUS SOLOSTAR) 100 UNIT/ML Solostar Pen Inject 40 Units into the skin daily. 30 units in the Morning / 10 units in the evening    [provider]  Insulin NPH, Human,, Isophane, (HUMULIN N KWIKPEN) 100 UNIT/ML Kiwkpen Inject 10 Units into the skin daily as needed. 10 units SubQ PRN FSBG is >450    [provider]  levothyroxine (SYNTHROID) 175 MCG tablet Take 175 mcg by mouth daily.    [provider]  loperamide (IMODIUM) 2 MG capsule Take 2 mg by mouth as needed for  diarrhea or loose stools. No more than 8 doses in 24 hours    [provider]  magnesium hydroxide (MILK OF MAGNESIA) 400 MG/5ML suspension Take 30 mLs by mouth daily as needed for mild constipation or moderate constipation.    [provider]  memantine (NAMENDA) 5 MG tablet Take 5 mg by mouth daily.    [provider]  nitroGLYCERIN (NITROSTAT) 0.4 MG SL tablet Place 0.4 mg under the tongue every 5 (five) minutes as needed for chest pain.    [provider]  pravastatin (PRAVACHOL) 40 MG tablet Take 40 mg by mouth daily.    [provider]  traZODone (DESYREL) 150 MG tablet Take 375 mg by mouth at bedtime.    [provider]    Allergies    Nsaids, Neosporin original [bacitracin-neomycin-polymyxin], and Other  Review of Systems   Review of Systems  Constitutional: Negative for activity change, chills and fever.  HENT: Negative for congestion and sore throat.   Eyes: Negative for visual disturbance.  Respiratory: Negative for cough, choking, shortness of breath and wheezing.   Cardiovascular: Positive for chest pain. Negative for palpitations and leg swelling.  Gastrointestinal: Negative for abdominal pain, diarrhea, nausea and vomiting.  Endocrine: Negative for polyuria.  Genitourinary: Negative for dysuria.  Musculoskeletal: Negative for back pain, myalgias, neck pain and neck stiffness.  Skin: Negative for rash and wound.  Allergic/Immunologic: Negative for immunocompromised state.  Neurological: Negative for seizures, syncope, weakness, numbness and headaches.  Psychiatric/Behavioral: Negative for confusion.    Physical Exam Updated Vital Signs BP (!) 199/83   Pulse 80   Temp 98 F (36.7 C) (Oral)   Resp 14   SpO2 100%   Physical Exam Vitals and nursing note reviewed.  Constitutional:      General: She is not in acute distress.    Appearance: She is not ill-appearing, toxic-appearing or diaphoretic.     Comments:  Pleasant.  Well-appearing.  No acute distress.  HENT:     Head: Normocephalic.     Mouth/Throat:     Mouth: Mucous membranes are moist.     Pharynx: No oropharyngeal exudate or posterior oropharyngeal erythema.     Comments: Posterior oropharynx is unremarkable Eyes:     Conjunctiva/sclera: Conjunctivae normal.  Cardiovascular:     Rate and Rhythm: Normal rate and regular rhythm.     Heart sounds: No murmur heard. No friction rub. No gallop.   Pulmonary:     Effort: Pulmonary effort is normal. No respiratory distress.     Breath sounds: No stridor. No wheezing, rhonchi or rales.     Comments: Tender to palpation to the left anterolateral inferior ribs.  No rashes.  No increased work of breathing. Chest:  Chest wall: Tenderness present.  Abdominal:     General: There is no distension.     Palpations: Abdomen is soft. There is no mass.     Tenderness: There is no abdominal tenderness. There is no right CVA tenderness, left CVA tenderness, guarding or rebound.     Hernia: No hernia is present.     Comments: Abdomen is soft, nontender, nondistended.  Musculoskeletal:     Cervical back: Neck supple.     Right lower leg: No edema.     Left lower leg: No edema.  Skin:    General: Skin is warm.     Capillary Refill: Capillary refill takes less than 2 seconds.     Coloration: Skin is not jaundiced or pale.     Findings: No rash.  Neurological:     Mental Status: She is alert.  Psychiatric:        Behavior: Behavior normal.    ED Results / Procedures / Treatments   Labs (all labs ordered are listed, but only abnormal results are displayed) Labs Reviewed  BASIC METABOLIC PANEL - Abnormal; Notable for the following components:      Result Value   Glucose, Bld 298 (*)    All other components within normal limits  CBC - Abnormal; Notable for the following components:   Hemoglobin 10.3 (*)    HCT 32.1 (*)    MCH 25.8 (*)    All other components within normal limits  TROPONIN  I (HIGH SENSITIVITY)  TROPONIN I (HIGH SENSITIVITY)    EKG EKG Interpretation  Date/Time:  Tuesday April 27 2020 14:01:59 EST Ventricular Rate:  76 PR Interval:  152 QRS Duration: 92 QT Interval:  406 QTC Calculation: 456 R Axis:   53 Text Interpretation: Normal sinus rhythm Septal infarct , age undetermined Abnormal ECG Confirmed by Thayer Jew [phone]) on 04/28/2020 5:49:01 AM   Radiology DG Chest 2 View  Result Date: 04/27/2020 CLINICAL DATA:  Chest pain. EXAM: CHEST - 2 VIEW COMPARISON:  March 25, 2020. FINDINGS: The heart size and mediastinal contours are within normal limits. Coronary artery stents. Both lungs are clear. No visible pleural effusions or pneumothorax. No acute osseous abnormality. Clips in the lower neck. IMPRESSION: No active cardiopulmonary disease. Electronically Signed   By: Margaretha Sheffield MD   On: 04/27/2020 14:38    Procedures Procedures (including critical care time)  Medications Ordered in ED Medications  alum & mag hydroxide-simeth (MAALOX/MYLANTA) 200-200-20 MG/5ML suspension 30 mL (30 mLs Oral Given 04/28/20 0641)    And  lidocaine (XYLOCAINE) 2 % viscous mouth solution 15 mL (15 mLs Oral Given 04/28/20 0641)  amLODipine (NORVASC) tablet 10 mg (10 mg Oral Given 04/28/20 [id number])    ED Course  I have reviewed the triage vital signs and the nursing notes.  Pertinent labs & imaging results that were available during my care of the patient were reviewed by me and considered in my medical decision making (see chart for details).    MDM Rules/Calculators/A&P                          70 year old female with history of dementia, Parkinson's disease, HLD, diabetes mellitus, CAD, CVD who presents to the emergency [facility] by EMS from [place] for chest pain that began while she was eating and was resolved after belching.  She was given nitroglycerin x3 and ASA by EMS, but reports that pain had resolved prior to administration of these  medications that she has not had recurrent pain.  Labs and imaging have been reviewed and independently interpreted by me.  She does report a history of CAD and was previously followed by cardiologist and [place], but unfortunately I do not have access to these records in care everywhere.  She also has multiple ER visits over the last couple of years for atypical chest pain with multiple negative cardiac enzymes.  Her symptoms today sound very atypical for ACS.  Normal sinus rhythm noted on EKG and she had 2 negative troponins.  I have a low suspicion for PE as she is having no shortness of breath. She is anemic, but this is stable from previous.  Glucose is 298 with normal anion gap and bicarb.  Doubt DKA or HHS.  I also considered other causes of chest pain including aortic dissection, tension pneumothorax, shingles, occult rib fracture, pericarditis, or myocarditis.  However, I have a low suspicion for these etiologies of chest pain and the patient today and I am more suspicious of dyspepsia versus musculoskeletal pain given that she had reproducible tenderness palpation to her left anterolateral ribs.  Patient has now been observed for more than 17 hours the emergency [facility] has had no recurrent episodes of chest pain.  I will provide her with a referral to cardiology for outpatient follow-up.  She has been given her home dose of amlodipine and a GI cocktail.  All questions answered.  She is hemodynamically stable no acute distress.  Safe for discharge to [place] with outpatient follow-up as indicated.  Final Clinical Impression(s) / ED Diagnoses Final diagnoses:  Atypical chest pain  Dyspepsia    Rx / DC Orders ED Discharge Orders    None       Joanne Gavel, PA-C 04/28/20 [id number]    Merryl Hacker, MD 04/29/20 0120

## 2020-04-28 NOTE — ED Notes (Signed)
Ptar called and arranged at this time.

## 2020-05-07 ENCOUNTER — Ambulatory Visit: Payer: Medicare Other | Admitting: Internal Medicine

## 2020-05-07 NOTE — Progress Notes (Deleted)
Cardiology Office Note:    Date:  05/07/2020   ID:  Cheryl Morse, DOB November 13, 1950, MRN [id number]  PCP:  Jeanette Caprice, PA-C  [facility] Cardiologist:  No primary care provider on file.  [facility] Electrophysiologist:  None   CC: *** Consulted for the evaluation of chest pain at the behest of Shepherd, [place], PA-C  History of Present Illness:    Cheryl Morse is a 70 y.o. female with a hx of CAD NOS, DM with HTN, HLD, Prior Stroke, Parkinsons disease with Dementia who presents for evaluation.  Patient notes that she is feeling ***.    Patient had recent chest pain ED visit for stabbing left sided chest pain while eating pancakges.    Has had no chest pain, chest pressure, chest tightness, chest stinging ***.  Discomfort occurs with ***, worsens with ***, and improves with ***.  Patient exertion notable for *** with *** and feels no symptoms.  No shortness of breath, DOE ***.  No PND or orthopnea***.  No bendopnea***, weight gain***, leg swelling ***, or abdominal swelling***.  No syncope or near syncope ***.  Notes *** no palpitations or funny heart beats.     Patient reports prior cardiac testing including *** echo, *** stress test, *** heart catheterizations, *** cardioversion, *** ablations.  No history of ***pre-eclampsia or prematurity.  No Fen-Phen or drug use***.  Ambulatory BP ***.   Past Medical History:  Diagnosis Date  . Cerebrovascular disease   . Coronary artery disease   . Dementia ([place])   . Diabetes mellitus without complication ([place])   . Hyperlipidemia   . Parkinson disease [facility])     Past Surgical History:  Procedure Laterality Date  . APPENDECTOMY    . EXCISION MASS HEAD    . EXPLORATORY LAPAROTOMY    . THYROIDECTOMY    . TONSILLECTOMY    . TUBAL LIGATION      Current Medications: No outpatient medications have been marked as taking for the 05/07/20 encounter (Appointment) with Werner Lean, MD.      Allergies:   Nsaids, Neosporin original [bacitracin-neomycin-polymyxin], and Other   Social History   Socioeconomic History  . Marital status: Widowed    Spouse name: Not on file  . Number of children: Not on file  . Years of education: Not on file  . Highest education level: Not on file  Occupational History  . Not on file  Tobacco Use  . Smoking status: Former Smoker    Packs/day: 1.00    Years: 60.00    Pack years: 60.00    Types: Cigarettes    Quit date: 09/09/2008    Years since quitting: 11.6  . Smokeless tobacco: Never Used  Substance and Sexual Activity  . Alcohol use: Never  . Drug use: Never  . Sexual activity: Not on file  Other Topics Concern  . Not on file  Social History Narrative  . Not on file   Social Determinants of Health   Financial Resource Strain: Not on file  Food Insecurity: Not on file  Transportation Needs: Not on file  Physical Activity: Not on file  Stress: Not on file  Social Connections: Not on file     Family History: The patient's family history includes COPD in her mother; Heart attack in her father. History of coronary artery disease notable for father. History of heart failure notable for ***. History of arrhythmia notable for ***.  ROS:   Please see the history of present illness.  All other systems reviewed and are negative.  EKGs/Labs/Other Studies Reviewed:    The following studies were reviewed today: ***  EKG:  EKG is *** ordered today.  The ekg ordered today demonstrates ***  Cardiac Event Monitoring***: Date: Results:  Transthoracic Echocardiogram***: Date: Results:  Transesophageal Echocardiogram***: Date: Results:  Cardiac/NonCardiac CT ()***: Date: Results:  NM Stress Testing : Date:09/23/2018 Results: FINDINGS:  Perfusion: No decreased activity in the left ventricle on stress  imaging to suggest reversible ischemia or infarction.   Wall Motion: Normal left ventricular wall motion. No  left  ventricular dilation.   Left Ventricular Ejection Fraction: 85 %   End diastolic volume 32 ml   End systolic volume 5 ml   IMPRESSION:  1. No reversible ischemia or infarction.   2. Normal left ventricular wall motion.   3. Left ventricular ejection fraction is 85%.   4. Non invasive risk stratification*: Low     Left/Right Heart Catheterizations***: Date: Results:   Recent Labs: 04/27/2020: BUN 13; Creatinine, Ser 1.00; Hemoglobin 10.3; Platelets 241; Potassium 3.5; Sodium 135  Recent Lipid Panel No results found for: CHOL, TRIG, HDL, CHOLHDL, VLDL, LDLCALC, LDLDIRECT   Risk Assessment/Calculations:   {Does this patient have ATRIAL FIBRILLATION?[phone]}   Physical Exam:    VS:  There were no vitals taken for this visit.    Wt Readings from Last 3 Encounters:  03/25/20 135 lb (61.2 kg)     GEN: *** Well nourished, well developed in no acute distress HEENT: Normal NECK: No JVD; No carotid bruits LYMPHATICS: No lymphadenopathy CARDIAC: ***RRR, no murmurs, rubs, gallops RESPIRATORY:  Clear to auscultation without rales, wheezing or rhonchi  ABDOMEN: Soft, non-tender, non-distended MUSCULOSKELETAL:  No edema; No deformity  SKIN: Warm and dry NEUROLOGIC:  Alert and oriented x 3 PSYCHIATRIC:  Normal affect   ASSESSMENT:    No diagnosis found. PLAN:    In order of problems listed above:  1. ***   {Are you ordering a CV Procedure (e.g. stress test, cath, DCCV, TEE, etc)?   Press F2        :[id number]}    Medication Adjustments/Labs and Tests Ordered: Current medicines are reviewed at length with the patient today.  Concerns regarding medicines are outlined above.  No orders of the defined types were placed in this encounter.  No orders of the defined types were placed in this encounter.   There are no Patient Instructions on file for this visit.   Signed, Werner Lean, MD  05/07/2020 8:21 AM    [place]

## 2020-05-25 ENCOUNTER — Encounter: Payer: Self-pay | Admitting: Internal Medicine

## 2020-05-25 ENCOUNTER — Ambulatory Visit (INDEPENDENT_AMBULATORY_CARE_PROVIDER_SITE_OTHER): Payer: Medicare Other | Admitting: Internal Medicine

## 2020-05-25 ENCOUNTER — Other Ambulatory Visit: Payer: Self-pay

## 2020-05-25 VITALS — BP 160/72 | HR 83 | Ht 65.5 in | Wt 143.8 lb

## 2020-05-25 DIAGNOSIS — I10 Essential (primary) hypertension: Secondary | ICD-10-CM | POA: Insufficient documentation

## 2020-05-25 DIAGNOSIS — F0390 Unspecified dementia without behavioral disturbance: Secondary | ICD-10-CM

## 2020-05-25 DIAGNOSIS — G20 Parkinson's disease: Secondary | ICD-10-CM | POA: Diagnosis not present

## 2020-05-25 DIAGNOSIS — I25118 Atherosclerotic heart disease of native coronary artery with other forms of angina pectoris: Secondary | ICD-10-CM | POA: Diagnosis not present

## 2020-05-25 MED ORDER — CARVEDILOL 6.25 MG PO TABS: 6.2500 mg | ORAL_TABLET | Freq: Two times a day (BID) | ORAL | 3 refills | Status: AC

## 2020-05-25 NOTE — Progress Notes (Signed)
Cardiology Office Note:    Date:  05/25/2020   ID:  Cheryl Morse, DOB May 01, 1950, MRN [id number]  PCP:  Jeanette Caprice, PA-C  [facility] Cardiologist:  Rudean Haskell MD (NEW) [place] Electrophysiologist:  None   CC: Told she had to come Consulted for the evaluation of chest pain at the behest of [place], [place], PA-C  History of Present Illness:    Cheryl Morse is a 70 y.o. female with a hx of CAD NOS, DM with HLD, Dementia possibly related to Parkinsons Disease who presents for evaluation 05/25/20.  Patient notes that she is feeling great.    Patient had recent ED evaluation: Chest pain under her left side and left breast  Occurring with eating pancakes and improve after belching.  Had previous CP eval in 03/25/20 after playing Bingo.  Has had 10+ Ed evaluations since beginning of 2021.  Through most of these, no evidence of ACS has been identified.  Patient has had prior cardiology care in [place]:   Had pain in her chest last night: sternal chest pain that radiated to her arm.  Improved with 5 nitroglycerins.  No chest pain today.  Discomfort occurs with sometimes with activity or meals, worsened with activity at the hospital, and improves with nitroglycerin and rest.  Patient activity largely consists of eating, sleeping, and playing bingo or sitting in her room and making jewerly.  Notes bad headache with nitroglycerin.  No shortness of breath, DOE.  No PND or orthopnea.  No bendopnea, weight gain (notes weight loss), leg swelling , or abdominal swelling.  No syncope or near syncope . Notes  no palpitations or funny heart beats.     Does not remember who her primary cardiologist was.   Past Medical History:  Diagnosis Date  . Cerebrovascular disease   . Coronary artery disease   . Dementia ([place])   . Diabetes mellitus without complication ([place])   . Hyperlipidemia   . Parkinson disease [facility])     Past Surgical History:  Procedure Laterality Date   . APPENDECTOMY    . EXCISION MASS HEAD    . EXPLORATORY LAPAROTOMY    . THYROIDECTOMY    . TONSILLECTOMY    . TUBAL LIGATION      Current Medications: Current Meds  Medication Sig  . ACETAMINOPHEN PO Take 500 mg by mouth in the morning, at noon, and at bedtime.  Marland Kitchen alum & mag hydroxide-simeth (MAALOX/MYLANTA) 200-200-20 MG/5ML suspension Take 30 mLs by mouth every 6 (six) hours as needed for indigestion or heartburn.  Marland Kitchen amLODipine (NORVASC) 10 MG tablet Take 10 mg by mouth daily.  . carbamazepine (TEGRETOL XR) 100 MG 12 hr tablet Take 100 mg by mouth in the morning, at noon, and at bedtime.  . carbidopa-levodopa (PARCOPA) 25-100 MG disintegrating tablet Take 1 tablet by mouth 3 (three) times daily.  . carvedilol (COREG) 6.25 MG tablet Take 1 tablet (6.25 mg total) by mouth 2 (two) times daily.  . clopidogrel (PLAVIX) 75 MG tablet Take 75 mg by mouth daily.  Marland Kitchen glimepiride (AMARYL) 4 MG tablet Take 4 mg by mouth daily.  Marland Kitchen glipiZIDE (GLUCOTROL) 5 MG tablet Take by mouth daily before breakfast.  . GUAIFENESIN PO Take 5 mLs by mouth as directed. Liquid 100mg /49ml  . insulin glargine (LANTUS SOLOSTAR) 100 UNIT/ML Solostar Pen Inject 40 Units into the skin daily. 30 units in the Morning / 10 units in the evening  . Insulin NPH, Human,, Isophane, (HUMULIN N KWIKPEN) 100 UNIT/ML Kiwkpen Inject  10 Units into the skin daily as needed. 10 units SubQ PRN FSBG is >450  . levothyroxine (SYNTHROID) 175 MCG tablet Take 175 mcg by mouth daily.  Marland Kitchen loperamide (IMODIUM) 2 MG capsule Take 2 mg by mouth as needed for diarrhea or loose stools. No more than 8 doses in 24 hours  . magnesium hydroxide (MILK OF MAGNESIA) 400 MG/5ML suspension Take 30 mLs by mouth daily as needed for mild constipation or moderate constipation.  . memantine (NAMENDA) 5 MG tablet Take 5 mg by mouth daily.  . nitroGLYCERIN (NITROSTAT) 0.4 MG SL tablet Place 0.4 mg under the tongue every 5 (five) minutes as needed for chest pain.  .  pravastatin (PRAVACHOL) 40 MG tablet Take 40 mg by mouth daily.  . traMADol (ULTRAM) 50 MG tablet Take 50 mg by mouth in the morning, at noon, and at bedtime.  . traZODone (DESYREL) 150 MG tablet Take 375 mg by mouth at bedtime.     Allergies:   Nsaids, Tomato, Neosporin original [bacitracin-neomycin-polymyxin], and Other   Social History   Socioeconomic History  . Marital status: Widowed    Spouse name: Not on file  . Number of children: Not on file  . Years of education: Not on file  . Highest education level: Not on file  Occupational History  . Not on file  Tobacco Use  . Smoking status: Former Smoker    Packs/day: 1.00    Years: 60.00    Pack years: 60.00    Types: Cigarettes    Quit date: 09/09/2008    Years since quitting: 11.7  . Smokeless tobacco: Never Used  Substance and Sexual Activity  . Alcohol use: Never  . Drug use: Never  . Sexual activity: Not on file  Other Topics Concern  . Not on file  Social History Narrative  . Not on file   Social Determinants of Health   Financial Resource Strain: Not on file  Food Insecurity: Not on file  Transportation Needs: Not on file  Physical Activity: Not on file  Stress: Not on file  Social Connections: Not on file    SOCIAL:  Sister in [place] helps make medical decisions.  Lives at [facility]  Family History: The patient's family history includes COPD in her mother; Heart attack in her father.  History of coronary artery disease notable for father and mother. History of heart failure notable for no members, but older sister had valve surgery. History of arrhythmia notable for no members.  ROS:   Please see the history of present illness.     All other systems reviewed and are negative.  EKGs/Labs/Other Studies Reviewed:    The following studies were reviewed today:  EKG:   04/28/20: SR rate 76, borderline criteria for septal infarct  Recent Labs: 04/27/2020: BUN 13;  Creatinine, Ser 1.00; Hemoglobin 10.3; Platelets 241; Potassium 3.5; Sodium 135  Recent Lipid Panel No results found for: CHOL, TRIG, HDL, CHOLHDL, VLDL, LDLCALC, LDLDIRECT   Risk Assessment/Calculations:     N/A  Physical Exam:    VS:  BP (!) 160/72   Pulse 83   Ht 5' 5.5" (1.664 m)   Wt 143 lb 12.8 oz (65.2 kg)   SpO2 98%   BMI 23.57 kg/m     Wt Readings from Last 3 Encounters:  05/25/20 143 lb 12.8 oz (65.2 kg)  03/25/20 135 lb (61.2 kg)     GEN:  Well nourished, well developed in no acute  distress HEENT: Normal NECK: No JVD; No carotid bruits LYMPHATICS: No lymphadenopathy CARDIAC: RRR, no murmurs, rubs, gallops RESPIRATORY:  Clear to auscultation without rales, wheezing or rhonchi  ABDOMEN: Soft, non-tender, non-distended MUSCULOSKELETAL:  No edema; No deformity  SKIN: Warm and dry NEUROLOGIC:  Alert and oriented x 3 PSYCHIATRIC:  Normal affect   ASSESSMENT:    1. Coronary artery disease of native artery of native heart with stable angina pectoris ([place])   2. Dementia without behavioral disturbance, unspecified dementia type ([place])   3. Parkinson disease ([place])   4. Essential hypertension    PLAN:    Coronary Artery Disease;  unclear Chart Review hx of Parkinsons Dementia HTN - symptomatic  - anatomy: Unclear - continue plavix 75 mg PO daily - Reasonable to continue statin if it causes no side effects - will start COREG 6.25 mg PO BID for BP and as anti anginal - if maximally titrated on BB, would add Ranexa; patient notes significant headaches on nitroglycerin; trial of IMDUR could be considered.  SHARED DECISION MAKING:  Patient notes that her goal of care is to be able to watch TV, eat her meals, make jewelery, and play bingo with out chest pain.  Given her overall goals of care; and Parkinson's with dementia; would advise a conservative therapeutic goal:  Using anti anginal's for CP.  If GOC changes, we could pursue an prognostic Lexiscan (not holding BB  or antianginal therapy prior to testing)  Three months follow up unless new symptoms or abnormal test results warranting change in plan  Would be reasonable for  APP Follow up    Medication Adjustments/Labs and Tests Ordered: Current medicines are reviewed at length with the patient today.  Concerns regarding medicines are outlined above.  No orders of the defined types were placed in this encounter.  Meds ordered this encounter  Medications  . carvedilol (COREG) 6.25 MG tablet    Sig: Take 1 tablet (6.25 mg total) by mouth 2 (two) times daily.    Dispense:  180 tablet    Refill:  3    Patient Instructions  Medication Instructions:  Your physician has recommended you make the following change in your medication:  START:carvedilol (Coreg)  6.25mg  by mouth twice a day  *If you need a refill on your cardiac medications before your next appointment, please call your pharmacy*   Lab Work: NONE If you have labs (blood work) drawn today and your tests are completely normal, you will receive your results only by: Marland Kitchen MyChart Message (if you have MyChart) OR . A paper copy in the mail If you have any lab test that is abnormal or we need to change your treatment, we will call you to review the results.   Testing/Procedures: NONE   Follow-Up: At [facility], you and your health needs are our priority.  As part of our continuing mission to provide you with exceptional heart care, we have created designated Provider Care Teams.  These Care Teams include your primary Cardiologist (physician) and Advanced Practice Providers (APPs -  Physician Assistants and Nurse Practitioners) who all work together to provide you with the care you need, when you need it.  We recommend signing up for the patient portal called "MyChart".  Sign up information is provided on this After Visit Summary.  MyChart is used to connect with patients for Virtual Visits (Telemedicine).  Patients are able to view  lab/test results, encounter notes, upcoming appointments, etc.  Non-urgent messages can be sent to  your provider as well.   To learn more about what you can do with MyChart, go to [email or web address].    Your next appointment:   3 month(s)  The format for your next appointment:   In Person  Provider:   You may see Gasper Sells, MD or one of the following Advanced Practice Providers on your designated Care Team:    Melina Copa, PA-C  Ermalinda Barrios, PA-C         Signed, Werner Lean, MD  05/25/2020 3:06 PM    [place]

## 2020-05-25 NOTE — Patient Instructions (Signed)
Medication Instructions:  Your physician has recommended you make the following change in your medication:  START:carvedilol (Coreg)  6.25mg  by mouth twice a day  *If you need a refill on your cardiac medications before your next appointment, please call your pharmacy*   Lab Work: NONE If you have labs (blood work) drawn today and your tests are completely normal, you will receive your results only by: Marland Kitchen MyChart Message (if you have MyChart) OR . A paper copy in the mail If you have any lab test that is abnormal or we need to change your treatment, we will call you to review the results.   Testing/Procedures: NONE   Follow-Up: At [facility], you and your health needs are our priority.  As part of our continuing mission to provide you with exceptional heart care, we have created designated Provider Care Teams.  These Care Teams include your primary Cardiologist (physician) and Advanced Practice Providers (APPs -  Physician Assistants and Nurse Practitioners) who all work together to provide you with the care you need, when you need it.  We recommend signing up for the patient portal called "MyChart".  Sign up information is provided on this After Visit Summary.  MyChart is used to connect with patients for Virtual Visits (Telemedicine).  Patients are able to view lab/test results, encounter notes, upcoming appointments, etc.  Non-urgent messages can be sent to your provider as well.   To learn more about what you can do with MyChart, go to [email or web address].    Your next appointment:   3 month(s)  The format for your next appointment:   In Person  Provider:   You may see Gasper Sells, MD or one of the following Advanced Practice Providers on your designated Care Team:    Melina Copa, PA-C  Ermalinda Barrios, PA-C

## 2020-05-28 ENCOUNTER — Telehealth: Payer: Self-pay | Admitting: Internal Medicine

## 2020-05-28 NOTE — Telephone Encounter (Signed)
New message:    Varney Biles said pt was seen by Dr Julieanne Manson on 05-26-20. She needs a form filled out for transportation, it is usually filled out when she come. She will fax this form in a few minutes, this form have to be filled out today please.

## 2020-06-01 ENCOUNTER — Emergency Department (HOSPITAL_COMMUNITY): Payer: Medicare Other

## 2020-06-01 ENCOUNTER — Emergency Department (HOSPITAL_COMMUNITY)
Admission: EM | Admit: 2020-06-01 | Discharge: 2020-06-01 | Disposition: A | Discharge status: Home / Self Care | Payer: Medicare Other | Attending: Emergency Medicine | Admitting: Emergency Medicine

## 2020-06-01 ENCOUNTER — Other Ambulatory Visit: Payer: Self-pay

## 2020-06-01 DIAGNOSIS — Z79899 Other long term (current) drug therapy: Secondary | ICD-10-CM | POA: Insufficient documentation

## 2020-06-01 DIAGNOSIS — G20 Parkinson's disease: Secondary | ICD-10-CM | POA: Insufficient documentation

## 2020-06-01 DIAGNOSIS — W19XXXA Unspecified fall, initial encounter: Secondary | ICD-10-CM

## 2020-06-01 DIAGNOSIS — M545 Low back pain, unspecified: Secondary | ICD-10-CM | POA: Insufficient documentation

## 2020-06-01 DIAGNOSIS — I25110 Atherosclerotic heart disease of native coronary artery with unstable angina pectoris: Secondary | ICD-10-CM | POA: Insufficient documentation

## 2020-06-01 DIAGNOSIS — S40011A Contusion of right shoulder, initial encounter: Secondary | ICD-10-CM

## 2020-06-01 DIAGNOSIS — Z794 Long term (current) use of insulin: Secondary | ICD-10-CM | POA: Insufficient documentation

## 2020-06-01 DIAGNOSIS — F0390 Unspecified dementia without behavioral disturbance: Secondary | ICD-10-CM | POA: Insufficient documentation

## 2020-06-01 DIAGNOSIS — Z7984 Long term (current) use of oral hypoglycemic drugs: Secondary | ICD-10-CM | POA: Diagnosis not present

## 2020-06-01 DIAGNOSIS — Z87891 Personal history of nicotine dependence: Secondary | ICD-10-CM | POA: Diagnosis not present

## 2020-06-01 DIAGNOSIS — W010XXA Fall on same level from slipping, tripping and stumbling without subsequent striking against object, initial encounter: Secondary | ICD-10-CM | POA: Diagnosis not present

## 2020-06-01 DIAGNOSIS — M79601 Pain in right arm: Secondary | ICD-10-CM | POA: Insufficient documentation

## 2020-06-01 DIAGNOSIS — Y92002 Bathroom of unspecified non-institutional (private) residence single-family (private) house as the place of occurrence of the external cause: Secondary | ICD-10-CM | POA: Diagnosis not present

## 2020-06-01 LAB — URINALYSIS, MICROSCOPIC (REFLEX)

## 2020-06-01 LAB — URINALYSIS, ROUTINE W REFLEX MICROSCOPIC
Bilirubin Urine: NEGATIVE
Glucose, UA: NEGATIVE mg/dL
Hgb urine dipstick: NEGATIVE
Ketones, ur: NEGATIVE mg/dL
Leukocytes,Ua: NEGATIVE
Nitrite: NEGATIVE
Protein, ur: >300 mg/dL — AB
Specific Gravity, Urine: 1.025 (ref 1.005–1.030)
pH: 6.5 (ref 5.0–8.0)

## 2020-06-01 MED ORDER — HYDROCODONE-ACETAMINOPHEN 5-325 MG PO TABS
1.0000 | ORAL_TABLET | Freq: Once | ORAL | Status: AC
  Administered 2020-06-01: 1 via ORAL
  Filled 2020-06-01: qty 1

## 2020-06-01 MED ORDER — HYDROCODONE-ACETAMINOPHEN 5-325 MG PO TABS
1.0000 | ORAL_TABLET | Freq: Once | ORAL | Status: DC
Start: 2020-06-01 — End: 2020-06-01

## 2020-06-01 NOTE — ED Notes (Addendum)
Pt ambualted with ease around room. Needed one person assist or walker when alone. No c/o of pain.

## 2020-06-01 NOTE — ED Triage Notes (Addendum)
Pt bib EMS from [facility]. Around 0330 this morning pt was getting up to go to bathroom and tripped. Pt was found against wall by employees at facility. Reports hitting head. Endorses upper back and shoulder pain.  EMS notes right arm deformity with right shoulder hematoma.   Pt on plavix.  A&Ox2 at baseline.  237mcg of fentanyl given en route  Vitals: BP: 194/91 HR: 66 O2: 96%  18G in Left AC

## 2020-06-01 NOTE — ED Provider Notes (Signed)
[facility] EMERGENCY DEPARTMENT Provider Note   CSN: [id number] Arrival date & time: 06/01/20  5643     History No chief complaint on file.   Cheryl Morse is a 70 y.o. female.  HPI     This is 70 year old female with a history of hyperlipidemia, diabetes, dementia, Parkinson's disease who presents following a fall.  Patient reports that she got out of bed to go to the bathroom.  The next thing she knew she was on the floor in the bathroom.  Patient does not remember falling or passing out.  She states that the last thing that she remembers is getting up from her bed quickly to go to the bathroom.  She states she has a history of syncope.  No chest pain or shortness of breath.  She is complaining mostly of right arm and upper back pain.  She is on Plavix.  She rates her pain at 8 out of 10.  Past Medical History:  Diagnosis Date  . Cerebrovascular disease   . Coronary artery disease   . Dementia ([place])   . Diabetes mellitus without complication ([place])   . Hyperlipidemia   . Parkinson disease [facility])     Patient Active Problem List   Diagnosis Date Noted  . Coronary artery disease of native artery of native heart with stable angina pectoris ([place]) 05/25/2020  . Essential hypertension 05/25/2020  . Dementia ([place])   . Parkinson disease ([place])   . Cerebrovascular disease     Past Surgical History:  Procedure Laterality Date  . APPENDECTOMY    . EXCISION MASS HEAD    . EXPLORATORY LAPAROTOMY    . THYROIDECTOMY    . TONSILLECTOMY    . TUBAL LIGATION       OB History   No obstetric history on file.     Family History  Problem Relation Age of Onset  . COPD Mother   . Heart attack Father     Social History   Tobacco Use  . Smoking status: Former Smoker    Packs/day: 1.00    Years: 60.00    Pack years: 60.00    Types: Cigarettes    Quit date: 09/09/2008    Years since quitting: 11.7  . Smokeless tobacco: Never Used  Substance Use Topics  .  Alcohol use: Never  . Drug use: Never    Home Medications Prior to Admission medications   Medication Sig Start Date End Date Taking? Authorizing Provider  acetaminophen (TYLENOL) 500 MG tablet Take 500 mg by mouth every 6 (six) hours as needed for headache, mild pain or fever.   Yes [provider]  ACETAMINOPHEN PO Take 500 mg by mouth in the morning, at noon, and at bedtime.   Yes [provider]  alum & mag hydroxide-simeth (MAALOX/MYLANTA) 200-200-20 MG/5ML suspension Take 30 mLs by mouth every 6 (six) hours as needed for indigestion or heartburn.   Yes [provider]  amLODipine (NORVASC) 10 MG tablet Take 10 mg by mouth daily.   Yes [provider]  carbamazepine (TEGRETOL XR) 100 MG 12 hr tablet Take 100 mg by mouth in the morning, at noon, and at bedtime. 04/20/20  Yes [provider]  carbidopa-levodopa (PARCOPA) 25-100 MG disintegrating tablet Take 1 tablet by mouth 3 (three) times daily.   Yes [provider]  carvedilol (COREG) 6.25 MG tablet Take 1 tablet (6.25 mg total) by mouth 2 (two) times daily. 05/25/20  Yes Werner Lean, MD  clopidogrel (PLAVIX) 75 MG tablet Take 75 mg by mouth daily.   Yes [provider]  glipiZIDE (GLUCOTROL) 5 MG tablet Take by mouth daily before breakfast.   Yes [provider]  GUAIFENESIN PO Take 10 mLs by mouth every 6 (six) hours as needed (cough). Liquid 100mg /80ml   Yes [provider]  insulin glargine (LANTUS SOLOSTAR) 100 UNIT/ML Solostar Pen Inject 10-30 Units into the skin See admin instructions. 30 units in the morning. 10 units in the evening   Yes [provider]  Insulin NPH, Human,, Isophane, (HUMULIN N KWIKPEN) 100 UNIT/ML Kiwkpen Inject 4-10 Units into the skin See admin instructions. 4 units with lunch and supper.   10 units SubQ PRN FSBG is >450   Yes [provider]  levothyroxine (SYNTHROID) 175 MCG tablet Take 175 mcg by mouth  daily.   Yes [provider]  loperamide (IMODIUM) 2 MG capsule Take 2 mg by mouth as needed for diarrhea or loose stools. No more than 8 doses in 24 hours   Yes [provider]  magnesium hydroxide (MILK OF MAGNESIA) 400 MG/5ML suspension Take 30 mLs by mouth daily as needed for mild constipation or moderate constipation.   Yes [provider]  memantine (NAMENDA) 5 MG tablet Take 5 mg by mouth daily.   Yes [provider]  nitroGLYCERIN (NITROSTAT) 0.4 MG SL tablet Place 0.4 mg under the tongue every 5 (five) minutes as needed for chest pain.   Yes [provider]  pravastatin (PRAVACHOL) 40 MG tablet Take 40 mg by mouth daily.   Yes [provider]  traMADol (ULTRAM) 50 MG tablet Take 50 mg by mouth in the morning, at noon, and at bedtime. 05/24/20  Yes [provider]  traZODone (DESYREL) 150 MG tablet Take 150 mg by mouth at bedtime.   Yes [provider]    Allergies    Nsaids, Tomato, Neosporin original [bacitracin-neomycin-polymyxin], and Other  Review of Systems   Review of Systems  Constitutional: Negative for fever.  Respiratory: Negative for shortness of breath.   Cardiovascular: Negative for chest pain.  Gastrointestinal: Negative for abdominal pain, nausea and vomiting.  Musculoskeletal:       Right arm pain  Neurological:       Loss of consciousness  All other systems reviewed and are negative.   Physical Exam Updated Vital Signs BP (!) 164/78   Pulse 63   Temp 98.1 F (36.7 C) (Oral)   Resp 11   SpO2 97%   Physical Exam Vitals and nursing note reviewed.  Constitutional:      Appearance: She is well-developed and well-nourished.     Comments: Elderly but nontoxic-appearing, no acute distress  HENT:     Head: Normocephalic and atraumatic.     Mouth/Throat:     Mouth: Mucous membranes are moist.  Eyes:     Pupils: Pupils are equal, round, and reactive to light.  Neck:     Comments:  C-collar in place Cardiovascular:     Rate and Rhythm: Normal rate and regular rhythm.     Heart sounds: Normal heart sounds.  Pulmonary:     Effort: Pulmonary effort is normal. No respiratory distress.     Breath sounds: No wheezing.  Abdominal:     General: Bowel sounds are normal.     Palpations: Abdomen is soft.     Tenderness: There is no abdominal tenderness.  Musculoskeletal:     Comments: Tenderness palpation upper humerus right  shoulder, decreased range of motion secondary to pain, slight hematoma noted, tenderness palpation right clavicle Normal range of motion bilateral hips and knees, no obvious deformities  Skin:    General: Skin is warm and dry.  Neurological:     Mental Status: She is alert and oriented to person, place, and time.     Comments: 5 out of 5 strength in all 4 extremities, no dysmetria to finger-nose-finger  Psychiatric:        Mood and Affect: Mood and affect and mood normal.     ED Results / Procedures / Treatments   Labs (all labs ordered are listed, but only abnormal results are displayed) Labs Reviewed  URINALYSIS, ROUTINE W REFLEX MICROSCOPIC    EKG EKG Interpretation  Date/Time:  Tuesday June 01 2020 05:36:12 EST Ventricular Rate:  64 PR Interval:    QRS Duration: 100 QT Interval:  449 QTC Calculation: 464 R Axis:   56 Text Interpretation: Sinus rhythm Confirmed by Thayer Jew [phone]) on 06/01/2020 5:47:00 AM   Radiology DG Chest 2 View  Result Date: 06/01/2020 CLINICAL DATA:  Fall.  Right shoulder pain. EXAM: CHEST - 2 VIEW COMPARISON:  04/27/2020 FINDINGS: 0602 hours. The cardio pericardial silhouette is enlarged. Interstitial markings are diffusely coarsened with chronic features. The lungs are clear without focal pneumonia, edema, pneumothorax or pleural effusion. The visualized bony structures of the thorax show no acute abnormality. Telemetry leads overlie the chest. IMPRESSION: Chronic interstitial coarsening. No active  cardiopulmonary disease. Electronically Signed   By: Misty Stanley M.D.   On: 06/01/2020 06:45   DG Shoulder Right  Result Date: 06/01/2020 CLINICAL DATA:  Fall.  Pain. EXAM: RIGHT SHOULDER - 2+ VIEW COMPARISON:  None. FINDINGS: No evidence of an acute fracture. No shoulder dislocation. Acromioclavicular and coracoclavicular distances are preserved. Mineralization in the distal rotator cuff compatible with calcific tendinitis/chronic injury. IMPRESSION: No acute findings. Electronically Signed   By: Misty Stanley M.D.   On: 06/01/2020 06:35   CT Head Wo Contrast  Result Date: 06/01/2020 CLINICAL DATA:  Patient fell in the bathroom and struck head. EXAM: CT HEAD WITHOUT CONTRAST CT CERVICAL SPINE WITHOUT CONTRAST TECHNIQUE: Multidetector CT imaging of the head and cervical spine was performed following the standard protocol without intravenous contrast. Multiplanar CT image reconstructions of the cervical spine were also generated. COMPARISON:  09/11/2019 FINDINGS: CT HEAD FINDINGS Brain: There is no evidence for acute hemorrhage, hydrocephalus, mass lesion, or abnormal extra-axial fluid collection. No definite CT evidence for acute infarction. Diffuse loss of parenchymal volume is consistent with atrophy. Patchy low attenuation in the deep hemispheric and periventricular white matter is nonspecific, but likely reflects chronic microvascular ischemic demyelination. Vascular: No hyperdense vessel or unexpected calcification. Skull: No evidence for fracture. No worrisome lytic or sclerotic lesion. Sinuses/Orbits: The visualized paranasal sinuses and mastoid air cells are clear. Visualized portions of the globes and intraorbital fat are unremarkable. Other: None. CT CERVICAL SPINE FINDINGS Alignment: Trace anterolisthesis of C4 on 5 is compatible with the facet degeneration at the same level. Skull base and vertebrae: No acute fracture. No primary bone lesion or focal pathologic process. Soft tissues and spinal  canal: No prevertebral fluid or swelling. No visible canal hematoma. Disc levels: Loss of disc height noted C5-6 with endplate degeneration. Multilevel facet osteoarthritis noted bilaterally, left greater than right. Upper chest: Negative. Other: None. IMPRESSION: 1. Stable head CT. No acute intracranial abnormality. Atrophy with chronic small vessel white matter ischemic disease. 2. Degenerative changes in the cervical  spine without fracture. Electronically Signed   By: Misty Stanley M.D.   On: 06/01/2020 06:41   CT Cervical Spine Wo Contrast  Result Date: 06/01/2020 CLINICAL DATA:  Patient fell in the bathroom and struck head. EXAM: CT HEAD WITHOUT CONTRAST CT CERVICAL SPINE WITHOUT CONTRAST TECHNIQUE: Multidetector CT imaging of the head and cervical spine was performed following the standard protocol without intravenous contrast. Multiplanar CT image reconstructions of the cervical spine were also generated. COMPARISON:  09/11/2019 FINDINGS: CT HEAD FINDINGS Brain: There is no evidence for acute hemorrhage, hydrocephalus, mass lesion, or abnormal extra-axial fluid collection. No definite CT evidence for acute infarction. Diffuse loss of parenchymal volume is consistent with atrophy. Patchy low attenuation in the deep hemispheric and periventricular white matter is nonspecific, but likely reflects chronic microvascular ischemic demyelination. Vascular: No hyperdense vessel or unexpected calcification. Skull: No evidence for fracture. No worrisome lytic or sclerotic lesion. Sinuses/Orbits: The visualized paranasal sinuses and mastoid air cells are clear. Visualized portions of the globes and intraorbital fat are unremarkable. Other: None. CT CERVICAL SPINE FINDINGS Alignment: Trace anterolisthesis of C4 on 5 is compatible with the facet degeneration at the same level. Skull base and vertebrae: No acute fracture. No primary bone lesion or focal pathologic process. Soft tissues and spinal canal: No prevertebral  fluid or swelling. No visible canal hematoma. Disc levels: Loss of disc height noted C5-6 with endplate degeneration. Multilevel facet osteoarthritis noted bilaterally, left greater than right. Upper chest: Negative. Other: None. IMPRESSION: 1. Stable head CT. No acute intracranial abnormality. Atrophy with chronic small vessel white matter ischemic disease. 2. Degenerative changes in the cervical spine without fracture. Electronically Signed   By: Misty Stanley M.D.   On: 06/01/2020 06:41   DG Humerus Right  Result Date: 06/01/2020 CLINICAL DATA:  Fall.  Pain. EXAM: RIGHT HUMERUS - 2+ VIEW COMPARISON:  None. FINDINGS: There is no evidence of fracture or other focal bone lesions. Soft tissues are unremarkable. IMPRESSION: Negative. Electronically Signed   By: Misty Stanley M.D.   On: 06/01/2020 06:35    Procedures Procedures   Medications Ordered in ED Medications  HYDROcodone-acetaminophen (NORCO/VICODIN) 5-325 MG per tablet 1 tablet (has no administration in time range)  HYDROcodone-acetaminophen (NORCO/VICODIN) 5-325 MG per tablet 1 tablet (1 tablet Oral Given 06/01/20 0640)    ED Course  I have reviewed the triage vital signs and the nursing notes.  Pertinent labs & imaging results that were available during my care of the patient were reviewed by me and considered in my medical decision making (see chart for details).  Clinical Course as of 06/01/20 0654  Tue Jun 01, 2020  6295 X-rays reviewed.  No obvious fracture.  C-collar was cleared at the bedside.  Patient with some range of motion of the shoulder.  Does not appear dislocated.  No significant deformities.  She has tenderness specifically over her scapula as well.  We will add this x-ray.  Repeat pain medication ordered.  Awaiting urinalysis. [CH]    Clinical Course User Index [CH] Auriana Scalia, Barbette Hair, MD   MDM Rules/Calculators/A&P                          Patient presents following a fall.  She is nontoxic-appearing and vital  signs notable for blood pressure 164/78.  She is on Plavix.  She is complaining mostly of right arm pain.  She does not recall the fall but feels like she may have been sleepy  when she woke up.  She also has a history of syncope but no associated recent illnesses, chest pain, shortness of breath.  EKG without arrhythmia or ischemia.  Will obtain a urinalysis to ensure she does not have a UTI.  She is not feeling dizzy at this time and her neuro exam is reassuring.  See clinical course above.  X-rays and CT head and neck are reassuring.  Scapula x-rays added.  Awaiting urinalysis.  Final Clinical Impression(s) / ED Diagnoses Final diagnoses:  None    Rx / DC Orders ED Discharge Orders    None       Mandy Peeks, Barbette Hair, MD 06/01/20 [phone]

## 2020-06-01 NOTE — ED Notes (Signed)
Patient verbalizes understanding of discharge instructions. Opportunity for questioning and answers were provided. Armband removed by staff, pt discharged from ED.  

## 2020-06-01 NOTE — ED Provider Notes (Signed)
Signout from Dr. Dina Rich.  70 year old female here for evaluation of injuries after a fall.  Has right arm and shoulder pain.  Plan is to follow-up on scapular film and urinalysis.  Will need ambulation prior to discharge. Physical Exam  BP (!) 164/78   Pulse 63   Temp 98.1 F (36.7 C) (Oral)   Resp 11   SpO2 97%   Physical Exam  ED Course/Procedures   Clinical Course as of 06/01/20 0711  Tue Jun 01, 2020  8979 X-rays reviewed.  No obvious fracture.  C-collar was cleared at the bedside.  Patient with some range of motion of the shoulder.  Does not appear dislocated.  No significant deformities.  She has tenderness specifically over her scapula as well.  We will add this x-ray.  Repeat pain medication ordered.  Awaiting urinalysis. [CH]    Clinical Course User Index [CH] Horton, Barbette Hair, MD    Procedures  MDM  Discussed with radiology tech.  Patient already had a AP and Y view for the shoulder x-rays.  Scapular x-rays would not add any further imaging to this.  Have canceled that.  Awaiting urinalysis.  11 AM.  Urinalysis unremarkable.  Nursing was able to ambulate patient without any difficulty.  Family here to pick her up.    Hayden Rasmussen, MD 06/01/20 [phone]

## 2020-06-01 NOTE — Discharge Instructions (Addendum)
You are seen in the emergency department for evaluation of injuries from a fall.  You had x-rays of your right shoulder and upper arm along with chest x-ray and CAT scan of your head and neck that did not show any serious findings.  Your urinalysis did not show obvious signs of infection.  Please use ice to the affected areas.  Tylenol for pain.  Follow-up with your doctor.  Return to the emergency department if any worsening or concerning symptoms

## 2020-06-02 NOTE — Telephone Encounter (Signed)
Called and spoke with [place] at [facility].  She received the signed transportation form back yesterday.

## 2020-08-10 ENCOUNTER — Other Ambulatory Visit: Payer: Self-pay

## 2020-08-10 ENCOUNTER — Emergency Department (HOSPITAL_BASED_OUTPATIENT_CLINIC_OR_DEPARTMENT_OTHER): Payer: Medicare Other | Admitting: Radiology

## 2020-08-10 ENCOUNTER — Encounter (HOSPITAL_BASED_OUTPATIENT_CLINIC_OR_DEPARTMENT_OTHER): Payer: Self-pay

## 2020-08-10 ENCOUNTER — Emergency Department (HOSPITAL_BASED_OUTPATIENT_CLINIC_OR_DEPARTMENT_OTHER)
Admission: EM | Admit: 2020-08-10 | Discharge: 2020-08-10 | Disposition: A | Discharge status: Home / Self Care | Payer: Medicare Other | Attending: Emergency Medicine | Admitting: Emergency Medicine

## 2020-08-10 DIAGNOSIS — M25511 Pain in right shoulder: Secondary | ICD-10-CM | POA: Diagnosis not present

## 2020-08-10 DIAGNOSIS — M1611 Unilateral primary osteoarthritis, right hip: Secondary | ICD-10-CM | POA: Diagnosis not present

## 2020-08-10 DIAGNOSIS — G20 Parkinson's disease: Secondary | ICD-10-CM | POA: Diagnosis not present

## 2020-08-10 DIAGNOSIS — I25118 Atherosclerotic heart disease of native coronary artery with other forms of angina pectoris: Secondary | ICD-10-CM | POA: Diagnosis not present

## 2020-08-10 DIAGNOSIS — M79604 Pain in right leg: Secondary | ICD-10-CM | POA: Diagnosis present

## 2020-08-10 DIAGNOSIS — Z794 Long term (current) use of insulin: Secondary | ICD-10-CM | POA: Diagnosis not present

## 2020-08-10 DIAGNOSIS — Z87891 Personal history of nicotine dependence: Secondary | ICD-10-CM | POA: Diagnosis not present

## 2020-08-10 DIAGNOSIS — Z7902 Long term (current) use of antithrombotics/antiplatelets: Secondary | ICD-10-CM | POA: Insufficient documentation

## 2020-08-10 DIAGNOSIS — F0390 Unspecified dementia without behavioral disturbance: Secondary | ICD-10-CM | POA: Insufficient documentation

## 2020-08-10 DIAGNOSIS — Z79899 Other long term (current) drug therapy: Secondary | ICD-10-CM | POA: Diagnosis not present

## 2020-08-10 DIAGNOSIS — E119 Type 2 diabetes mellitus without complications: Secondary | ICD-10-CM | POA: Insufficient documentation

## 2020-08-10 DIAGNOSIS — M25551 Pain in right hip: Secondary | ICD-10-CM

## 2020-08-10 MED ORDER — FENTANYL CITRATE (PF) 100 MCG/2ML IJ SOLN
50.0000 ug | Freq: Once | INTRAMUSCULAR | Status: AC
  Administered 2020-08-10: 50 ug via INTRAMUSCULAR
  Filled 2020-08-10: qty 2

## 2020-08-10 MED ORDER — LIDOCAINE 5 % EX PTCH: 1.0000 | MEDICATED_PATCH | CUTANEOUS | 0 refills | Status: DC

## 2020-08-10 NOTE — ED Triage Notes (Signed)
Patient arrived via [place] from [facility].  Right thigh pain starting this morning at 0300, denies injury, denies falling.

## 2020-08-10 NOTE — ED Provider Notes (Signed)
[place] EMERGENCY DEPT Provider Note   CSN: [id number] Arrival date & time: 08/10/20  1129     History Chief Complaint  Patient presents with  . Leg Pain    Azyria Osmon is a 70 y.o. female.  HPI     70 year old female with a history of dementia, diabetes, hyperlipidemia, Parkinson's disease, coronary artery disease, presents with concern for right leg pain from [facility].  Reports she woke this morning with right proximal leg pain, groin/hip pain that began at 3 AM.  Reports that she went to get out of bed to go to the bathroom, and she suddenly felt this pain.  Think she might of pulled a muscle.  Reports after that, she has had difficulty walking.  Normally she walks with a walker.  Reports the pain is worse with movement and walking.  Denies any falls or trauma.  Denies chest pain or shortness of breath.  Denies abdominal pain, nausea, vomiting or diarrhea.  Denies fevers or chills.  Past Medical History:  Diagnosis Date  . Cerebrovascular disease   . Coronary artery disease   . Dementia ([place])   . Diabetes mellitus without complication ([place])   . Hyperlipidemia   . Parkinson disease [facility])     Patient Active Problem List   Diagnosis Date Noted  . Coronary artery disease of native artery of native heart with stable angina pectoris ([place]) 05/25/2020  . Essential hypertension 05/25/2020  . Dementia ([place])   . Parkinson disease ([place])   . Cerebrovascular disease     Past Surgical History:  Procedure Laterality Date  . APPENDECTOMY    . EXCISION MASS HEAD    . EXPLORATORY LAPAROTOMY    . THYROIDECTOMY    . TONSILLECTOMY    . TUBAL LIGATION       OB History   No obstetric history on file.     Family History  Problem Relation Age of Onset  . COPD Mother   . Heart attack Father     Social History   Tobacco Use  . Smoking status: Former Smoker    Packs/day: 1.00    Years: 60.00    Pack years: 60.00    Types: Cigarettes    Quit  date: 09/09/2008    Years since quitting: 11.9  . Smokeless tobacco: Never Used  Substance Use Topics  . Alcohol use: Never  . Drug use: Never    Home Medications Prior to Admission medications   Medication Sig Start Date End Date Taking? Authorizing Provider  lidocaine (LIDODERM) 5 % Place 1 patch onto the skin daily. Remove & Discard patch within 12 hours or as directed by MD 08/10/20  Yes Gareth Morgan, MD  acetaminophen (TYLENOL) 500 MG tablet Take 500 mg by mouth every 6 (six) hours as needed for headache, mild pain or fever.    [provider]  ACETAMINOPHEN PO Take 500 mg by mouth in the morning, at noon, and at bedtime.    [provider]  alum & mag hydroxide-simeth (MAALOX/MYLANTA) 200-200-20 MG/5ML suspension Take 30 mLs by mouth every 6 (six) hours as needed for indigestion or heartburn.    [provider]  amLODipine (NORVASC) 10 MG tablet Take 10 mg by mouth daily.    [provider]  carbamazepine (TEGRETOL XR) 100 MG 12 hr tablet Take 100 mg by mouth in the morning, at noon, and at bedtime. 04/20/20   [provider]  carbidopa-levodopa (PARCOPA) 25-100 MG disintegrating tablet Take 1 tablet  by mouth 3 (three) times daily.    [provider]  carvedilol (COREG) 6.25 MG tablet Take 1 tablet (6.25 mg total) by mouth 2 (two) times daily. 05/25/20   Werner Lean, MD  clopidogrel (PLAVIX) 75 MG tablet Take 75 mg by mouth daily.    [provider]  glipiZIDE (GLUCOTROL) 5 MG tablet Take by mouth daily before breakfast.    [provider]  GUAIFENESIN PO Take 10 mLs by mouth every 6 (six) hours as needed (cough). Liquid 100mg /12ml    [provider]  insulin glargine (LANTUS SOLOSTAR) 100 UNIT/ML Solostar Pen Inject 10-30 Units into the skin See admin instructions. 30 units in the morning. 10 units in the evening    [provider]  Insulin NPH, Human,, Isophane, (HUMULIN N KWIKPEN) 100  UNIT/ML Kiwkpen Inject 4-10 Units into the skin See admin instructions. 4 units with lunch and supper.   10 units SubQ PRN FSBG is >450    [provider]  levothyroxine (SYNTHROID) 175 MCG tablet Take 175 mcg by mouth daily.    [provider]  loperamide (IMODIUM) 2 MG capsule Take 2 mg by mouth as needed for diarrhea or loose stools. No more than 8 doses in 24 hours    [provider]  magnesium hydroxide (MILK OF MAGNESIA) 400 MG/5ML suspension Take 30 mLs by mouth daily as needed for mild constipation or moderate constipation.    [provider]  memantine (NAMENDA) 5 MG tablet Take 5 mg by mouth daily.    [provider]  nitroGLYCERIN (NITROSTAT) 0.4 MG SL tablet Place 0.4 mg under the tongue every 5 (five) minutes as needed for chest pain.    [provider]  pravastatin (PRAVACHOL) 40 MG tablet Take 40 mg by mouth daily.    [provider]  traMADol (ULTRAM) 50 MG tablet Take 50 mg by mouth in the morning, at noon, and at bedtime. 05/24/20   [provider]  traZODone (DESYREL) 150 MG tablet Take 150 mg by mouth at bedtime.    [provider]    Allergies    Nsaids, Tomato, Neosporin original [bacitracin-neomycin-polymyxin], and Other  Review of Systems   Review of Systems  Constitutional: Negative for fever.  Gastrointestinal: Negative for abdominal pain, diarrhea, nausea and vomiting.  Genitourinary: Negative for dysuria.  Musculoskeletal: Positive for arthralgias and myalgias. Negative for back pain.  Neurological: Negative for weakness and numbness.    Physical Exam Updated Vital Signs BP (!) 169/73 (BP Location: Right Arm)   Pulse 72   Temp 98.1 F (36.7 C) (Oral)   Resp 16   Ht 5\' 5"  (1.651 m)   Wt 64.9 kg   SpO2 100%   BMI 23.80 kg/m   Physical Exam Vitals and nursing note reviewed.  Constitutional:      General: She is not in acute distress.    Appearance: She is well-developed.  She is not diaphoretic.  HENT:     Head: Normocephalic and atraumatic.  Eyes:     Conjunctiva/sclera: Conjunctivae normal.  Cardiovascular:     Rate and Rhythm: Normal rate and regular rhythm.     Pulses: Normal pulses.  Pulmonary:     Effort: Pulmonary effort is normal. No respiratory distress.     Breath sounds: Normal breath sounds.  Abdominal:     General: There is no distension.     Palpations: Abdomen is soft.     Tenderness: There is no abdominal tenderness. There  is no guarding.  Musculoskeletal:        General: Tenderness (right lateral hip, towards groin, normal strength, normal RO Mbut reports pain to right hip) present.     Cervical back: Normal range of motion.  Skin:    General: Skin is warm and dry.     Findings: No erythema or rash.  Neurological:     Mental Status: She is alert and oriented to person, place, and time.     ED Results / Procedures / Treatments   Labs (all labs ordered are listed, but only abnormal results are displayed) Labs Reviewed - No data to display  EKG None  Radiology DG Hip Unilat W or Wo Pelvis 2-3 Views Right  Result Date: 08/10/2020 CLINICAL DATA:  Severe right hip pain.  No injury. EXAM: DG HIP (WITH OR WITHOUT PELVIS) 2-3V RIGHT COMPARISON:  CT abdomen pelvis dated March 23, 2019. FINDINGS: No acute fracture or dislocation. Mild bilateral hip joint space narrowing with chondrocalcinosis. Osteopenia. Soft tissues are unremarkable. IMPRESSION: 1. 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 Electronically Signed   By: Titus Dubin M.D.   On: 08/10/2020 12:47    Procedures Procedures   Medications Ordered in ED Medications  fentaNYL (SUBLIMAZE) injection 50 mcg (50 mcg Intramuscular Given 08/10/20 1247)    ED Course  I have reviewed the triage vital signs and the nursing notes.  Pertinent labs & imaging results that were available during my care of the patient were reviewed by me and considered in my medical  decision making (see chart for details).    MDM Rules/Calculators/A&P                            70 year old female with a history of dementia, diabetes, hyperlipidemia, Parkinson's disease, coronary artery disease, presents with concern for right leg pain from [facility].   She has strong bilateral lower extremity pulses, no sign of acute arterial thrombus.  Location of pain not consistent with DVT as localized to proximal hip, does not have DVT risk factors, no asymmetric leg swelling, and given history and exam have low suspicion that this pain represents DVT.   I do not see signs of cellulitis, abscess, or other infection on exam.  She is afebrile with good range of motion of the hip and have low suspicion for septic arthritis. Does not have back pain, no red flags. No abdominal pain to suggest intraabdominal etiology of pain. Normal pulses bilaterally and low suspicion for AAA or dissection.   X-rays completed of the right hip shows osteoarthritis without other acute findings. Possible pain related to exacerbation of arthritis or muscular strain.  Given rx for lidoderm patch. Patient discharged in stable condition with understanding of reasons to return.   Final Clinical Impression(s) / ED Diagnoses Final diagnoses:  Right hip pain  Primary osteoarthritis of right hip    Rx / DC Orders ED Discharge Orders         Ordered    lidocaine (LIDODERM) 5 %  Every 24 hours        08/10/20 1401           Gareth Morgan, MD 08/10/20 1440

## 2020-08-10 NOTE — ED Notes (Signed)
Gave pt diet Cola and graham crackers, per Vicente Serene - RN.

## 2020-08-10 NOTE — ED Notes (Signed)
Prior to vitals being done, pt stated that she had to use the bathroom. Pt placed on bedpan.

## 2020-08-10 NOTE — ED Notes (Signed)
Ambulated patient in room, patient able to walk with assistance. Patient states pain is 9/10 with ambulation but able to take small steps.

## 2020-08-10 NOTE — ED Notes (Signed)
[facility] called to transport patient back to [facility].

## 2020-08-21 ENCOUNTER — Encounter (HOSPITAL_COMMUNITY): Payer: Self-pay | Admitting: Pharmacy Technician

## 2020-08-21 ENCOUNTER — Other Ambulatory Visit: Payer: Self-pay

## 2020-08-21 ENCOUNTER — Observation Stay (HOSPITAL_COMMUNITY)
Admission: EM | Admit: 2020-08-21 | Discharge: 2020-08-22 | Disposition: A | Discharge status: Home / Self Care | Payer: Medicare Other | Attending: Student in an Organized Health Care Education/Training Program | Admitting: Student in an Organized Health Care Education/Training Program

## 2020-08-21 ENCOUNTER — Emergency Department (HOSPITAL_COMMUNITY): Payer: Medicare Other

## 2020-08-21 ENCOUNTER — Observation Stay (HOSPITAL_COMMUNITY): Payer: Medicare Other

## 2020-08-21 DIAGNOSIS — Z7984 Long term (current) use of oral hypoglycemic drugs: Secondary | ICD-10-CM | POA: Insufficient documentation

## 2020-08-21 DIAGNOSIS — R569 Unspecified convulsions: Principal | ICD-10-CM

## 2020-08-21 DIAGNOSIS — Z794 Long term (current) use of insulin: Secondary | ICD-10-CM | POA: Insufficient documentation

## 2020-08-21 DIAGNOSIS — F0390 Unspecified dementia without behavioral disturbance: Secondary | ICD-10-CM | POA: Diagnosis not present

## 2020-08-21 DIAGNOSIS — I2510 Atherosclerotic heart disease of native coronary artery without angina pectoris: Secondary | ICD-10-CM | POA: Diagnosis not present

## 2020-08-21 DIAGNOSIS — Z87891 Personal history of nicotine dependence: Secondary | ICD-10-CM | POA: Diagnosis not present

## 2020-08-21 DIAGNOSIS — Z79899 Other long term (current) drug therapy: Secondary | ICD-10-CM | POA: Diagnosis not present

## 2020-08-21 DIAGNOSIS — R4182 Altered mental status, unspecified: Secondary | ICD-10-CM | POA: Diagnosis present

## 2020-08-21 DIAGNOSIS — Z20822 Contact with and (suspected) exposure to covid-19: Secondary | ICD-10-CM | POA: Insufficient documentation

## 2020-08-21 DIAGNOSIS — G20 Parkinson's disease: Secondary | ICD-10-CM | POA: Diagnosis present

## 2020-08-21 DIAGNOSIS — E039 Hypothyroidism, unspecified: Secondary | ICD-10-CM | POA: Diagnosis not present

## 2020-08-21 DIAGNOSIS — Z8673 Personal history of transient ischemic attack (TIA), and cerebral infarction without residual deficits: Secondary | ICD-10-CM | POA: Diagnosis not present

## 2020-08-21 DIAGNOSIS — E1165 Type 2 diabetes mellitus with hyperglycemia: Secondary | ICD-10-CM

## 2020-08-21 DIAGNOSIS — I10 Essential (primary) hypertension: Secondary | ICD-10-CM | POA: Diagnosis not present

## 2020-08-21 DIAGNOSIS — R4189 Other symptoms and signs involving cognitive functions and awareness: Secondary | ICD-10-CM | POA: Diagnosis not present

## 2020-08-21 DIAGNOSIS — E119 Type 2 diabetes mellitus without complications: Secondary | ICD-10-CM | POA: Diagnosis not present

## 2020-08-21 DIAGNOSIS — G20A1 Parkinson's disease without dyskinesia, without mention of fluctuations: Secondary | ICD-10-CM | POA: Diagnosis present

## 2020-08-21 LAB — CBC
HCT: 30.8 % — ABNORMAL LOW (ref 36.0–46.0)
Hemoglobin: 10.2 g/dL — ABNORMAL LOW (ref 12.0–15.0)
MCH: 27 pg (ref 26.0–34.0)
MCHC: 33.1 g/dL (ref 30.0–36.0)
MCV: 81.5 fL (ref 80.0–100.0)
Platelets: 210 10*3/uL (ref 150–400)
RBC: 3.78 MIL/uL — ABNORMAL LOW (ref 3.87–5.11)
RDW: 14.1 % (ref 11.5–15.5)
WBC: 3.9 10*3/uL — ABNORMAL LOW (ref 4.0–10.5)
nRBC: 0 % (ref 0.0–0.2)

## 2020-08-21 LAB — COMPREHENSIVE METABOLIC PANEL
ALT: 12 U/L (ref 0–44)
AST: 16 U/L (ref 15–41)
Albumin: 3.2 g/dL — ABNORMAL LOW (ref 3.5–5.0)
Alkaline Phosphatase: 68 U/L (ref 38–126)
Anion gap: 8 (ref 5–15)
BUN: 11 mg/dL (ref 8–23)
CO2: 27 mmol/L (ref 22–32)
Calcium: 8.6 mg/dL — ABNORMAL LOW (ref 8.9–10.3)
Chloride: 105 mmol/L (ref 98–111)
Creatinine, Ser: 0.99 mg/dL (ref 0.44–1.00)
GFR, Estimated: >60 mL/min (ref >60)
Glucose, Bld: 227 mg/dL — ABNORMAL HIGH (ref 70–99)
Potassium: 3.5 mmol/L (ref 3.5–5.1)
Sodium: 140 mmol/L (ref 135–145)
Total Bilirubin: 0.3 mg/dL (ref 0.3–1.2)
Total Protein: 6.1 g/dL — ABNORMAL LOW (ref 6.5–8.1)

## 2020-08-21 LAB — I-STAT CHEM 8, ED
BUN: 12 mg/dL (ref 8–23)
Calcium, Ion: 1.1 mmol/L — ABNORMAL LOW (ref 1.15–1.40)
Chloride: 104 mmol/L (ref 98–111)
Creatinine, Ser: 0.9 mg/dL (ref 0.44–1.00)
Glucose, Bld: 226 mg/dL — ABNORMAL HIGH (ref 70–99)
HCT: 28 % — ABNORMAL LOW (ref 36.0–46.0)
Hemoglobin: 9.5 g/dL — ABNORMAL LOW (ref 12.0–15.0)
Potassium: 3.5 mmol/L (ref 3.5–5.1)
Sodium: 140 mmol/L (ref 135–145)
TCO2: 24 mmol/L (ref 22–32)

## 2020-08-21 LAB — RESP PANEL BY RT-PCR (FLU A&B, COVID) ARPGX2
Influenza A by PCR: NEGATIVE
Influenza B by PCR: NEGATIVE
SARS Coronavirus 2 by RT PCR: NEGATIVE

## 2020-08-21 LAB — DIFFERENTIAL
Abs Immature Granulocytes: 0.01 10*3/uL (ref 0.00–0.07)
Basophils Absolute: 0 10*3/uL (ref 0.0–0.1)
Basophils Relative: 1 %
Eosinophils Absolute: 0.2 10*3/uL (ref 0.0–0.5)
Eosinophils Relative: 4 %
Immature Granulocytes: 0 %
Lymphocytes Relative: 29 %
Lymphs Abs: 1.1 10*3/uL (ref 0.7–4.0)
Monocytes Absolute: 0.5 10*3/uL (ref 0.1–1.0)
Monocytes Relative: 12 %
Neutro Abs: 2.1 10*3/uL (ref 1.7–7.7)
Neutrophils Relative %: 54 %

## 2020-08-21 LAB — URINALYSIS, ROUTINE W REFLEX MICROSCOPIC
Bilirubin Urine: NEGATIVE
Glucose, UA: 50 mg/dL — AB
Hgb urine dipstick: NEGATIVE
Ketones, ur: NEGATIVE mg/dL
Leukocytes,Ua: NEGATIVE
Nitrite: NEGATIVE
Protein, ur: 100 mg/dL — AB
Specific Gravity, Urine: 1.008 (ref 1.005–1.030)
pH: 7 (ref 5.0–8.0)

## 2020-08-21 LAB — CBG MONITORING, ED: Glucose-Capillary: 209 mg/dL — ABNORMAL HIGH (ref 70–99)

## 2020-08-21 LAB — PROTIME-INR
INR: 1 (ref 0.8–1.2)
Prothrombin Time: 13 seconds (ref 11.4–15.2)

## 2020-08-21 LAB — HEMOGLOBIN A1C
Hgb A1c MFr Bld: 5.9 % — ABNORMAL HIGH (ref 4.8–5.6)
Mean Plasma Glucose: 122.63 mg/dL

## 2020-08-21 LAB — APTT: aPTT: 24 seconds (ref 24–36)

## 2020-08-21 LAB — TSH: TSH: 0.022 u[IU]/mL — ABNORMAL LOW (ref 0.350–4.500)

## 2020-08-21 MED ORDER — LEVETIRACETAM 500 MG PO TABS
500.0000 mg | ORAL_TABLET | Freq: Two times a day (BID) | ORAL | Status: DC
  Administered 2020-08-21 – 2020-08-22 (×2): 500 mg via ORAL
  Filled 2020-08-21 (×2): qty 1

## 2020-08-21 MED ORDER — LEVETIRACETAM IN NACL 1500 MG/100ML IV SOLN
1500.0000 mg | Freq: Once | INTRAVENOUS | Status: DC
  Filled 2020-08-21: qty 100

## 2020-08-21 MED ORDER — CARBIDOPA-LEVODOPA 25-100 MG PO TABS
1.0000 | ORAL_TABLET | Freq: Three times a day (TID) | ORAL | Status: DC
  Administered 2020-08-22 (×2): 1 via ORAL
  Filled 2020-08-21 (×2): qty 1

## 2020-08-21 MED ORDER — SENNOSIDES-DOCUSATE SODIUM 8.6-50 MG PO TABS: 1.0000 | ORAL_TABLET | Freq: Every evening | ORAL | Status: DC | PRN

## 2020-08-21 MED ORDER — CARVEDILOL 6.25 MG PO TABS
6.2500 mg | ORAL_TABLET | Freq: Two times a day (BID) | ORAL | Status: DC
  Administered 2020-08-22 (×2): 6.25 mg via ORAL
  Filled 2020-08-21 (×2): qty 1

## 2020-08-21 MED ORDER — ACETAMINOPHEN 650 MG RE SUPP: 650.0000 mg | Freq: Four times a day (QID) | RECTAL | Status: DC | PRN

## 2020-08-21 MED ORDER — GLIPIZIDE 5 MG PO TABS
5.0000 mg | ORAL_TABLET | Freq: Every day | ORAL | Status: DC
  Administered 2020-08-22: 5 mg via ORAL
  Filled 2020-08-21: qty 1

## 2020-08-21 MED ORDER — MEMANTINE HCL 10 MG PO TABS
5.0000 mg | ORAL_TABLET | Freq: Two times a day (BID) | ORAL | Status: DC
  Administered 2020-08-22: 5 mg via ORAL
  Filled 2020-08-21: qty 1

## 2020-08-21 MED ORDER — SODIUM CHLORIDE 0.9% FLUSH: 3.0000 mL | Freq: Once | INTRAVENOUS | Status: DC

## 2020-08-21 MED ORDER — LEVOTHYROXINE SODIUM 100 MCG PO TABS
175.0000 ug | ORAL_TABLET | Freq: Every day | ORAL | Status: DC
Start: 2020-08-22 — End: 2020-08-22
  Filled 2020-08-21: qty 1

## 2020-08-21 MED ORDER — PRAVASTATIN SODIUM 40 MG PO TABS: 40.0000 mg | ORAL_TABLET | Freq: Every day | ORAL | Status: DC

## 2020-08-21 MED ORDER — AMLODIPINE BESYLATE 10 MG PO TABS
10.0000 mg | ORAL_TABLET | Freq: Every day | ORAL | Status: DC
  Administered 2020-08-22: 10 mg via ORAL
  Filled 2020-08-21: qty 1

## 2020-08-21 MED ORDER — ACETAMINOPHEN 325 MG PO TABS
650.0000 mg | ORAL_TABLET | Freq: Four times a day (QID) | ORAL | Status: DC | PRN
  Administered 2020-08-22: 650 mg via ORAL
  Filled 2020-08-21: qty 2

## 2020-08-21 NOTE — Evaluation (Signed)
Occupational Therapy Evaluation Patient Details Name: Cheryl Morse MRN: [id number] DOB: 1951/02/15 Today's Date: 08/21/2020    History of Present Illness 70 y/o female presented to ED as code stroke/possible seizure activity. 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 MRI pending. PMH: stroke with mild residual weeakness in R arm/face/leg, dementia, PD, CAD, DM2, HL   Clinical Impression   This 70 yo female admitted with above presents to acute OT at an overall min guard A level when up on her feet with RW for basic ADLs. She will continue to benefit from acute OT without need for follow up.    Follow Up Recommendations  No OT follow up; 24 hour S   Equipment Recommendations  None recommended by OT       Precautions / Restrictions Precautions Precautions: Fall Restrictions Weight Bearing Restrictions: No      Mobility Bed Mobility Overal bed mobility: Needs Assistance Bed Mobility: Supine to Sit;Sit to Supine     Supine to sit: Supervision;HOB elevated (increased time and took 2 tries with momentum A) Sit to supine: Supervision      Transfers Overall transfer level: Needs assistance Equipment used: Rolling walker (2 wheeled) Transfers: Sit to/from Stand Sit to Stand: Min guard         General transfer comment: rollator, safe with hand placement    Balance Overall balance assessment: Mild deficits observed, not formally tested                                         ADL either performed or assessed with clinical judgement   ADL Overall ADL's : Needs assistance/impaired Eating/Feeding: Independent;Sitting Eating/Feeding Details (indicate cue type and reason): EOB Grooming: Wash/dry hands;Supervision/safety;Standing   Upper Body Bathing: Set up;Sitting   Lower Body Bathing: Sit to/from stand;Min guard   Upper Body Dressing : Set up;Sitting   Lower Body Dressing: Sit to/from stand;Min guard   Toilet Transfer:  Stand-pivot;BSC;RW;Min guard   Toileting- Clothing Manipulation and Hygiene: Sit to/from stand;Min guard               Vision Baseline Vision/History: Wears glasses Wears Glasses: At all times Patient Visual Report: No change from baseline              Pertinent Vitals/Pain Pain Assessment: No/denies pain     Hand Dominance  right   Extremity/Trunk Assessment Upper Extremity Assessment Upper Extremity Assessment: Overall WFL for tasks assessed     Communication Communication Communication: No difficulties   Cognition Arousal/Alertness: Awake/alert Behavior During Therapy: WFL for tasks assessed/performed Overall Cognitive Status: History of cognitive impairments - at baseline                                 General Comments: Disoriented to situation. Follows commands consistently. Baseline dementia              Home Living Family/patient expects to be discharged to:: Assisted living                             Home Equipment: Walker - 4 wheels          Prior Functioning/Environment Level of Independence: Needs assistance  Gait / Transfers Assistance Needed: Reports ambulated with rollator independently ADL's / Homemaking Assistance Needed: requires  assistance with bathing but able to dress herself            OT Problem List: Impaired balance (sitting and/or standing);Decreased cognition      OT Treatment/Interventions: Self-care/ADL training;DME and/or AE instruction;Patient/family education;Balance training    OT Goals(Current goals can be found in the care plan section) Acute Rehab OT Goals Patient Stated Goal: to be able to eat OT Goal Formulation: With patient Time For Goal Achievement: 09/04/20 Potential to Achieve Goals: Good  OT Frequency: Min 2X/week              AM-PAC OT "6 Clicks" Daily Activity     Outcome Measure Help from another person eating meals?: None Help from another person taking care of  personal grooming?: A Little Help from another person toileting, which includes using toliet, bedpan, or urinal?: A Little Help from another person bathing (including washing, rinsing, drying)?: A Little Help from another person to put on and taking off regular upper body clothing?: A Little Help from another person to put on and taking off regular lower body clothing?: A Little 6 Click Score: 19   End of Session Equipment Utilized During Treatment: Gait belt (rollator)  Activity Tolerance: Patient tolerated treatment well Patient left: in bed;with call bell/phone within reach;with bed alarm set  OT Visit Diagnosis: Unsteadiness on feet (R26.81)                Time: 5809-9833 OT Time Calculation (min): 20 min Charges:  OT General Charges $OT Visit: 1 Visit OT Evaluation $OT Eval Moderate Complexity: 1 Mod  [place] [address], OTR/L Acute [employer] Pager [phone] Office [phone]     Almon Register 08/21/2020, 3:48 PM

## 2020-08-21 NOTE — Progress Notes (Signed)
Pt admitted to 3W AxOx3, confused to situation and tearful because of it. "I don't know why I'm here". Pt aware that she's supposed to get an EEG. Pt also stated that she's very hungry. VS as per flow; BP elevated. Pt oriented to 3W processes. Pt familiar with the [facility] system. All questions and concerns addressed. Call bell placed within reach, will continue to monitor and maintain safety.

## 2020-08-21 NOTE — ED Provider Notes (Signed)
[facility] EMERGENCY DEPARTMENT Provider Note   CSN: [id number] Arrival date & time: 08/21/20  5638     History No chief complaint on file.   Cheryl Morse is a 70 y.o. female.  Level 5 caveat secondary to dementia.  70 year old female presenting as a code stroke activation.  She is at a nursing home.  Was last known well at 6:30 AM.  Was noted by staff at 830 to be staring into space and not responding.  EMS found her same and then while they were transporting she became verbal again.  She appears to be back to her baseline.  The history is provided by the patient and the EMS personnel.  Altered Mental Status Presenting symptoms: partial responsiveness   Severity:  Unable to specify Most recent episode:  Today Episode history:  Single Timing:  Constant Progression:  Resolved Chronicity:  New Context: dementia        Past Medical History:  Diagnosis Date  . Cerebrovascular disease   . Coronary artery disease   . Dementia ([place])   . Diabetes mellitus without complication ([place])   . Hyperlipidemia   . Parkinson disease [facility])     Patient Active Problem List   Diagnosis Date Noted  . Coronary artery disease of native artery of native heart with stable angina pectoris ([place]) 05/25/2020  . Essential hypertension 05/25/2020  . Dementia ([place])   . Parkinson disease ([place])   . Cerebrovascular disease     Past Surgical History:  Procedure Laterality Date  . APPENDECTOMY    . EXCISION MASS HEAD    . EXPLORATORY LAPAROTOMY    . THYROIDECTOMY    . TONSILLECTOMY    . TUBAL LIGATION       OB History   No obstetric history on file.     Family History  Problem Relation Age of Onset  . COPD Mother   . Heart attack Father     Social History   Tobacco Use  . Smoking status: Former Smoker    Packs/day: 1.00    Years: 60.00    Pack years: 60.00    Types: Cigarettes    Quit date: 09/09/2008    Years since quitting: 11.9  . Smokeless tobacco: Never  Used  Substance Use Topics  . Alcohol use: Never  . Drug use: Never    Home Medications Prior to Admission medications   Medication Sig Start Date End Date Taking? Authorizing Provider  acetaminophen (TYLENOL) 500 MG tablet Take 500 mg by mouth every 6 (six) hours as needed for headache, mild pain or fever.    [provider]  ACETAMINOPHEN PO Take 500 mg by mouth in the morning, at noon, and at bedtime.    [provider]  alum & mag hydroxide-simeth (MAALOX/MYLANTA) 200-200-20 MG/5ML suspension Take 30 mLs by mouth every 6 (six) hours as needed for indigestion or heartburn.    [provider]  amLODipine (NORVASC) 10 MG tablet Take 10 mg by mouth daily.    [provider]  carbamazepine (TEGRETOL XR) 100 MG 12 hr tablet Take 100 mg by mouth in the morning, at noon, and at bedtime. 04/20/20   [provider]  carbidopa-levodopa (PARCOPA) 25-100 MG disintegrating tablet Take 1 tablet by mouth 3 (three) times daily.    [provider]  carvedilol (COREG) 6.25 MG tablet Take 1 tablet (6.25 mg total) by mouth 2 (two) times daily. 05/25/20   Werner Lean, MD  clopidogrel (PLAVIX) 75  MG tablet Take 75 mg by mouth daily.    [provider]  glipiZIDE (GLUCOTROL) 5 MG tablet Take by mouth daily before breakfast.    [provider]  GUAIFENESIN PO Take 10 mLs by mouth every 6 (six) hours as needed (cough). Liquid 100mg /31ml    [provider]  insulin glargine (LANTUS SOLOSTAR) 100 UNIT/ML Solostar Pen Inject 10-30 Units into the skin See admin instructions. 30 units in the morning. 10 units in the evening    [provider]  Insulin NPH, Human,, Isophane, (HUMULIN N KWIKPEN) 100 UNIT/ML Kiwkpen Inject 4-10 Units into the skin See admin instructions. 4 units with lunch and supper.   10 units SubQ PRN FSBG is >450    [provider]  levothyroxine (SYNTHROID) 175 MCG tablet Take 175 mcg by mouth  daily.    [provider]  lidocaine (LIDODERM) 5 % Place 1 patch onto the skin daily. Remove & Discard patch within 12 hours or as directed by MD 08/10/20   Gareth Morgan, MD  loperamide (IMODIUM) 2 MG capsule Take 2 mg by mouth as needed for diarrhea or loose stools. No more than 8 doses in 24 hours    [provider]  magnesium hydroxide (MILK OF MAGNESIA) 400 MG/5ML suspension Take 30 mLs by mouth daily as needed for mild constipation or moderate constipation.    [provider]  memantine (NAMENDA) 5 MG tablet Take 5 mg by mouth daily.    [provider]  nitroGLYCERIN (NITROSTAT) 0.4 MG SL tablet Place 0.4 mg under the tongue every 5 (five) minutes as needed for chest pain.    [provider]  pravastatin (PRAVACHOL) 40 MG tablet Take 40 mg by mouth daily.    [provider]  traMADol (ULTRAM) 50 MG tablet Take 50 mg by mouth in the morning, at noon, and at bedtime. 05/24/20   [provider]  traZODone (DESYREL) 150 MG tablet Take 150 mg by mouth at bedtime.    [provider]    Allergies    Nsaids, Tomato, Neosporin original [bacitracin-neomycin-polymyxin], and Other  Review of Systems   Review of Systems  Unable to perform ROS: Dementia    Physical Exam Updated Vital Signs BP (!) 198/81   Pulse 87   Temp 98.7 F (37.1 C) (Oral)   Resp 17   SpO2 100%   Physical Exam Vitals and nursing note reviewed.  Constitutional:      General: She is not in acute distress.    Appearance: Normal appearance. She is well-developed.  HENT:     Head: Normocephalic and atraumatic.  Eyes:     Conjunctiva/sclera: Conjunctivae normal.  Cardiovascular:     Rate and Rhythm: Normal rate and regular rhythm.     Heart sounds: No murmur heard.   Pulmonary:     Effort: Pulmonary effort is normal. No respiratory distress.     Breath sounds: Normal breath sounds.  Abdominal:     Palpations: Abdomen is soft.      Tenderness: There is no abdominal tenderness.  Musculoskeletal:     Cervical back: Neck supple.  Skin:    General: Skin is warm and dry.  Neurological:     Mental Status: She is alert. Mental status is at baseline.     Cranial Nerves: Cranial nerve deficit present.     Sensory: Sensory deficit present.     Motor: Weakness present.     Comments: She is some right facial droop.  Extraocular movements intact.  Visual fields intact confrontation.  4 out of 5 strength right arm and right leg.  Left side preserved.     ED Results / Procedures / Treatments   Labs (all labs ordered are listed, but only abnormal results are displayed) Labs Reviewed  CBC - Abnormal; Notable for the following components:      Result Value   WBC 3.9 (*)    RBC 3.78 (*)    Hemoglobin 10.2 (*)    HCT 30.8 (*)    All other components within normal limits  COMPREHENSIVE METABOLIC PANEL - Abnormal; Notable for the following components:   Glucose, Bld 227 (*)    Calcium 8.6 (*)    Total Protein 6.1 (*)    Albumin 3.2 (*)    All other components within normal limits  URINALYSIS, ROUTINE W REFLEX MICROSCOPIC - Abnormal; Notable for the following components:   Color, Urine STRAW (*)    Glucose, UA 50 (*)    Protein, ur 100 (*)    Bacteria, UA RARE (*)    All other components within normal limits  I-STAT CHEM 8, ED - Abnormal; Notable for the following components:   Glucose, Bld 226 (*)    Calcium, Ion 1.10 (*)    Hemoglobin 9.5 (*)    HCT 28.0 (*)    All other components within normal limits  CBG MONITORING, ED - Abnormal; Notable for the following components:   Glucose-Capillary 209 (*)    All other components within normal limits  RESP PANEL BY RT-PCR (FLU A&B, COVID) ARPGX2  URINE CULTURE  PROTIME-INR  APTT  DIFFERENTIAL  HIV ANTIBODY (ROUTINE TESTING W REFLEX)  TSH  HEMOGLOBIN J6B  BASIC METABOLIC PANEL  CBC    EKG EKG Interpretation  Date/Time:  Saturday August 21 2020 10:16:46  EDT Ventricular Rate:  73 PR Interval:  151 QRS Duration: 96 QT Interval:  425 QTC Calculation: 469 R Axis:   19 Text Interpretation: Sinus rhythm Borderline repolarization abnormality No significant change since prior 2/22 Confirmed by Aletta Edouard [phone]) on 08/21/2020 10:20:52 AM   Radiology EEG adult  Result Date: 08/21/2020 Lora Havens, MD     08/21/2020  5:10 PM Patient Name: Raiyah Speakman MRN: [id number] Epilepsy Attending: Lora Havens Referring Physician/Provider: Dr Su Monks Date: 08/21/2020 Duration: 24.53 mins Patient history: 70 year old history with history of prior stroke with mild residual weakness in her right arm/face/leg, dementia, PD, CAD, DM2, HL who presents to ED as stroke code for episode of staring off into space with decreased responsiveness favored to be focal seizure with impaired awareness. EEG to evaluate for seizure Level of alertness: Awake AEDs during EEG study: LEV Technical aspects: This EEG study was done with scalp electrodes positioned according to the 10-20 International system of electrode placement. Electrical activity was acquired at a sampling rate of 500Hz  and reviewed with a high frequency filter of 70Hz  and a low frequency filter of 1Hz . EEG data were recorded continuously and digitally stored. Description: No posterior dominant rhythm was seen. There is an excessive amount of 15 to 18 Hz beta activity with irregular morphology distributed symmetrically and diffusely. Hyperventilation and photic stimulation were not performed.   ABNORMALITY - Excessive beta, generalized IMPRESSION: This study is within normal limits. No seizures or epileptiform discharges were seen throughout the recording. The excessive beta activity seen in the background is most likely due to the effect of benzodiazepine and is a benign EEG pattern. Priyanka Barbra Sarks  CT HEAD CODE STROKE WO CONTRAST  Result Date: 08/21/2020 CLINICAL DATA:  Code stroke. 70 year old  female with altered mental status. EXAM: CT HEAD WITHOUT CONTRAST TECHNIQUE: Contiguous axial images were obtained from the base of the skull through the vertex without intravenous contrast. COMPARISON:  Brain MRI 05/17/2019.  Head CT 06/01/2020. FINDINGS: Brain: Probable chronic lacunar infarct left basal ganglia and internal capsule is stable. Small chronic infarct in the left inferior cerebellum is stable. Gray-white matter differentiation elsewhere is stable and within normal limits. Chronic brainstem volume loss. No midline shift, ventriculomegaly, mass effect, evidence of mass lesion, intracranial hemorrhage or evidence of cortically based acute infarction. Vascular: Calcified atherosclerosis at the skull base. No suspicious intracranial vascular hyperdensity. Skull: Stable, negative. Sinuses/Orbits: Visualized paranasal sinuses and mastoids are stable and well aerated. Other: Visualized orbits and scalp soft tissues are negative aside from chronic left suboccipital scalp scarring. ASPECTS [facility] Stroke Program Early CT Score) Total score (0-10 with 10 being normal): 10 (chronic encephalomalacia). IMPRESSION: 1. Stable CT appearance of the brain since February with chronic infarcts in the left basal ganglia and left cerebellum. 2.  ASPECTS 10. 3. These results were communicated to Dr. Erlinda Hong at 10:09 am on 08/21/2020 by text page via the [facility] messaging system. Electronically Signed   By: Genevie Ann M.D.   On: 08/21/2020 10:09    Procedures .Critical Care Performed by: Hayden Rasmussen, MD Authorized by: Hayden Rasmussen, MD   Critical care provider statement:    Critical care time (minutes):  45   Critical care time was exclusive of:  Separately billable procedures and treating other patients   Critical care was necessary to treat or prevent imminent or life-threatening deterioration of the following conditions:  CNS failure or compromise   Critical care was time spent personally by me on the  following activities:  Discussions with consultants, evaluation of patient's response to treatment, examination of patient, ordering and performing treatments and interventions, ordering and review of laboratory studies, ordering and review of radiographic studies, pulse oximetry, re-evaluation of patient's condition, obtaining history from patient or surrogate, review of old charts and development of treatment plan with patient or surrogate     Medications Ordered in ED Medications  sodium chloride flush (NS) 0.9 % injection 3 mL (has no administration in time range)  levETIRAcetam (KEPPRA) IVPB 1500 mg/ 100 mL premix (has no administration in time range)    Followed by  levETIRAcetam (KEPPRA) tablet 500 mg (has no administration in time range)  acetaminophen (TYLENOL) tablet 650 mg (has no administration in time range)    Or  acetaminophen (TYLENOL) suppository 650 mg (has no administration in time range)  senna-docusate (Senokot-S) tablet 1 tablet (has no administration in time range)    ED Course  I have reviewed the triage vital signs and the nursing notes.  Pertinent labs & imaging results that were available during my care of the patient were reviewed by me and considered in my medical decision making (see chart for details).  Clinical Course as of 08/21/20 1739  Sat Aug 21, 2020  1028 Dr. Quinn Axe feels the patient may have had a seizure.  She is going to put her in for an EEG.  She recommends a medical admission.  I spoke with the internal medicine teaching service who will evaluate her for admission. [MB]    Clinical Course User Index [MB] Hayden Rasmussen, MD   MDM Rules/Calculators/A&P  This patient complains of less responsive episode code stroke; this involves an extensive number of treatment Options and is a complaint that carries with it a high risk of complications and Morbidity. The differential includes stroke, bleed, seizure, metabolic  derangement, hypoglycemia, arrhythmia  I ordered, reviewed and interpreted labs, which included CBC with low white count, low hemoglobin similar to priors, chemistries with elevated glucose, fairly normal LFTs, COVID testing negative, urinalysis without signs of infection I ordered medication IV Keppra I ordered imaging studies which included CT head and I independently    visualized and interpreted imaging which showed no acute findings Additional history obtained from EMS Previous records obtained and reviewed in epic, no recent admissions I consulted neurology Dr. Quinn Axe and internal medicine teaching service and discussed lab and imaging findings  Critical Interventions: Work-up and management of patient's acute altered mental status  After the interventions stated above, I reevaluated the patient and found patient to be symptomatically improved.  She will need EEG and further work-up inpatient.   Final Clinical Impression(s) / ED Diagnoses Final diagnoses:  Unresponsive episode    Rx / DC Orders ED Discharge Orders    None       Hayden Rasmussen, MD 08/21/20 1742

## 2020-08-21 NOTE — Evaluation (Signed)
Physical Therapy Evaluation Patient Details Name: Cheryl Morse MRN: [id number] DOB: Dec 06, 1950 Today's Date: 08/21/2020   History of Present Illness  70 y/o female presented to ED as code stroke/possible seizure activity. 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 MRI pending. PMH: stroke with mild residual weeakness in R arm/face/leg, dementia, PD, CAD, DM2, HL  Clinical Impression  PTA, patient lives at [place] in [facility] and reports modI for mobility with rollator and requires assistance for bathing. Patient currently functioning at min guard for OOB mobility for safety. Patient presents with R LE weakness, impaired sensation, impaired balance, and decreased activity tolerance. Patient will benefit from skilled PT services during acute stay to address listed deficits. Recommend HHPT following discharge at ALF to maximize functional mobility and independence.     Follow Up Recommendations Home health PT (at ALF)    Equipment Recommendations  None recommended by PT    Recommendations for Other Services       Precautions / Restrictions Precautions Precautions: Fall Restrictions Weight Bearing Restrictions: No      Mobility  Bed Mobility Overal bed mobility: Needs Assistance Bed Mobility: Supine to Sit;Sit to Supine     Supine to sit: Min assist Sit to supine: Supervision   General bed mobility comments: minA for trunk elevation to EOB. Supervision to return to bed    Transfers Overall transfer level: Needs assistance Equipment used: Rolling Cheryl Morse (2 wheeled) Transfers: Sit to/from Stand Sit to Stand: Min guard         General transfer comment: min guard for safety. Cues for hand placement  Ambulation/Gait Ambulation/Gait assistance: Min guard Gait Distance (Feet): 25 Feet Assistive device: Rolling Cheryl Morse (2 wheeled) Gait Pattern/deviations: Step-to pattern;Decreased step length - right;Decreased stride length;Decreased dorsiflexion  - right;Shuffle;Trunk flexed Gait velocity: decreased Gait velocity interpretation: <1.8 ft/sec, indicate of risk for recurrent falls General Gait Details: Patient tends to drag R foot which she reports as baseline. Noted foot drag near end of ambulation which may be due to fatigue. Min guard for Scientist, research (medical)    Modified Rankin (Stroke Patients Only)       Balance Overall balance assessment: Mild deficits observed, not formally tested                                           Pertinent Vitals/Pain Pain Assessment: No/denies pain    Home Living Family/patient expects to be discharged to:: Assisted living               Home Equipment: Cheryl Morse - 4 wheels      Prior Function Level of Independence: Needs assistance   Gait / Transfers Assistance Needed: Reports ambulated with rollator independently  ADL's / Homemaking Assistance Needed: requires assistance with bathing but able to dress herself        Hand Dominance        Extremity/Trunk Assessment   Upper Extremity Assessment Upper Extremity Assessment: Overall WFL for tasks assessed    Lower Extremity Assessment Lower Extremity Assessment: RLE deficits/detail RLE Deficits / Details: grossly 4/5 on R RLE Sensation: decreased light touch       Communication   Communication: No difficulties  Cognition Arousal/Alertness: Awake/alert Behavior During Therapy: WFL for tasks assessed/performed Overall Cognitive Status: History of cognitive impairments - at baseline  General Comments: Disoriented to situation. Follows commands consistently. Baseline dementia      General Comments      Exercises     Assessment/Plan    PT Assessment Patient needs continued PT services  PT Problem List Decreased strength;Decreased activity tolerance;Decreased balance;Decreased mobility;Decreased cognition;Impaired  sensation;Decreased knowledge of use of DME       PT Treatment Interventions DME instruction;Gait training;Functional mobility training;Therapeutic activities;Therapeutic exercise;Balance training;Patient/family education    PT Goals (Current goals can be found in the Care Plan section)  Acute Rehab PT Goals Patient Stated Goal: to go home PT Goal Formulation: With patient Time For Goal Achievement: 09/04/20 Potential to Achieve Goals: Good    Frequency Min 3X/week   Barriers to discharge        Co-evaluation               AM-PAC PT "6 Clicks" Mobility  Outcome Measure Help needed turning from your back to your side while in a flat bed without using bedrails?: A Little Help needed moving from lying on your back to sitting on the side of a flat bed without using bedrails?: A Little Help needed moving to and from a bed to a chair (including a wheelchair)?: A Little Help needed standing up from a chair using your arms (e.g., wheelchair or bedside chair)?: A Little Help needed to walk in hospital room?: A Little Help needed climbing 3-5 steps with a railing? : A Little 6 Click Score: 18    End of Session Equipment Utilized During Treatment: Gait belt Activity Tolerance: Patient tolerated treatment well Patient left: in bed;with call bell/phone within reach;with bed alarm set (per RN request) Nurse Communication: Mobility status PT Visit Diagnosis: Unsteadiness on feet (R26.81);Muscle weakness (generalized) (M62.81);Difficulty in walking, not elsewhere classified (R26.2)    Time: 5361-4431 PT Time Calculation (min) (ACUTE ONLY): 15 min   Charges:   PT Evaluation $PT Eval Moderate Complexity: 1 Mod          Cheryl Morse A. Gilford Rile PT, DPT Acute Rehabilitation Services Pager [phone] Office [phone]   Linna Hoff 08/21/2020, 3:40 PM

## 2020-08-21 NOTE — Progress Notes (Signed)
Patient Cheryl Morse was seen being belligerent and and agitated concerning her NPO status for failing recent swallow screen at around 1100 4/30. Upon talking to the charge RN and I, She desired to eat something and requests leaving the hospital if she cannot.    I did a repeat swallow screen at 2043 4/30. She passed with no difficulty. I have paged IMTS for further instruction.

## 2020-08-21 NOTE — Progress Notes (Signed)
Called [phone] to call patient's daughter Lovey Newcomer, however this was the incorrect number and could not reach her daughter.

## 2020-08-21 NOTE — ED Triage Notes (Signed)
Pt bib ems from [place] as a code stroke. LKW 0630 today. At [place] pt was found by staff holding her R arm, not responding and not following commands. EMS was called and episode lasted approx 2 minutes after their arrival. Pt did not recall event. Pt with hx CVA with residual R sided weakness and sensory deficits.  186/59 HR 75 99% RA CBG 200 98% RA

## 2020-08-21 NOTE — Consult Note (Addendum)
NEUROLOGY CONSULTATION NOTE   Date of service: August 21, 2020 Patient Name: Cheryl Morse MRN:  [id number] DOB:  1950-05-13 Reason for consult: stroke code _ _ _   _ __   _ __ _ _  __ __   _ __   __ _  History of Present Illness   A 70 year old history with history of prior stroke with mild residual weakness in her right arm/face/leg, dementia, PD, CAD, DM2, HL who presents to ED as stroke code.  Last known well 6:30 AM.  Was noted by staff at 830 to be staring into space and unresponsive.  She remained staring off until EMS came but while transporting she became verbal again and returned to her baseline.  NIHSS was 4 for mild weakness and sensory deficit in her right face arm and leg.  All of these deficits were chronic for her per patient.  tPA was not administered due to no new neurologic deficits and suspicion for etiology being seizure.  CTA was not performed because there were no new deficits.  She has never had a similar prior episode. No known hx seizures. She has no memory of the event this AM.    ROS   10 point review of systems was performed and was negative except as described in HPI.  Past History   Past Medical History:  Diagnosis Date  . Cerebrovascular disease   . Coronary artery disease   . Dementia ([place])   . Diabetes mellitus without complication ([place])   . Hyperlipidemia   . Parkinson disease [facility])    Past Surgical History:  Procedure Laterality Date  . APPENDECTOMY    . EXCISION MASS HEAD    . EXPLORATORY LAPAROTOMY    . THYROIDECTOMY    . TONSILLECTOMY    . TUBAL LIGATION     Family History  Problem Relation Age of Onset  . COPD Mother   . Heart attack Father    Social History   Socioeconomic History  . Marital status: Widowed    Spouse name: Not on file  . Number of children: Not on file  . Years of education: Not on file  . Highest education level: Not on file  Occupational History  . Not on file  Tobacco Use  . Smoking status: Former  Smoker    Packs/day: 1.00    Years: 60.00    Pack years: 60.00    Types: Cigarettes    Quit date: 09/09/2008    Years since quitting: 11.9  . Smokeless tobacco: Never Used  Substance and Sexual Activity  . Alcohol use: Never  . Drug use: Never  . Sexual activity: Not on file  Other Topics Concern  . Not on file  Social History Narrative  . Not on file   Social Determinants of Health   Financial Resource Strain: Not on file  Food Insecurity: Not on file  Transportation Needs: Not on file  Physical Activity: Not on file  Stress: Not on file  Social Connections: Not on file   Allergies  Allergen Reactions  . Nsaids     Other reaction(s): GI Upset (intolerance)  . Tomato Hives  . Neosporin Original [Bacitracin-Neomycin-Polymyxin]   . Other Hives    Tomato     Medications   (Not in a hospital admission)    Vitals   Vitals:   08/21/20 1018 08/21/20 1030 08/21/20 1045 08/21/20 1100  BP:  (!) 193/83 (!) 200/81 (!) 202/75  Pulse:  76 78 79  Resp:  15 17 17   Temp: 98.4 F (36.9 C)     SpO2:  100% 100% 100%     There is no height or weight on file to calculate BMI.  Physical Exam   Physical Exam Gen: A&O x4, NAD HEENT: Atraumatic, normocephalic;mucous membranes moist; oropharynx clear, tongue without atrophy or fasciculations. Neck: Supple, trachea midline. Resp: CTAB, no w/r/r CV: RRR, no m/g/r; nml S1 and S2. 2+ symmetric peripheral pulses. Abd: soft/NT/ND; nabs x 4 quad Extrem: Nml bulk; no cyanosis, clubbing, or edema.  Neuro: *MS: A&O x4. Follows multi-step commands.  *Speech: fluid, nondysarthric, able to name and repeat *CN:    I: Deferred   II,III: PERRLA, VFF by confrontation, optic discs sharp   III,IV,VI: EOMI w/o nystagmus, no ptosis   V: Sensation intact from V1 to V3 to LT   VII: Eyelid closure was full.  R UMN facial droop   VIII: Hearing intact to voice   IX,X: Voice normal, palate elevates symmetrically    XI: SCM/trap 5/5 bilat    XII: Tongue protrudes midline, no atrophy or fasciculations   *Motor:   Normal bulk.  No tremor, rigidity or bradykinesia. Drift RUE and RLE, LUE and LLE 5/5. *Sensory: Mild impairment to LT RUE and RLE *Coordination:  Finger-to-nose, heel-to-shin, rapid alternating motions were intact. *Reflexes:  2+ and symmetric throughout without clonus; toes down-going bilat *Gait: deferred  NIHSS  1a Level of Conscious.: 0 1b LOC Questions: 0 1c LOC Commands: 0 2 Best Gaze: 0 3 Visual: 0 4 Facial Palsy: 1 5a Motor Arm - left: 0 5b Motor Arm - Right: 1 6a Motor Leg - Left: 0 6b Motor Leg - Right: 1 7 Limb Ataxia: 0 8 Sensory: 1 9 Best Language: 0 10 Dysarthria: 0 11 Extinct. and Inatten.: 0  TOTAL: 4   Premorbid mRS = 3   Labs   CBC:  Recent Labs  Lab 08/21/20 0949 08/21/20 0956  WBC 3.9*  --   NEUTROABS 2.1  --   HGB 10.2* 9.5*  HCT 30.8* 28.0*  MCV 81.5  --   PLT 210  --     Basic Metabolic Panel:  Lab Results  Component Value Date   NA 140 08/21/2020   K 3.5 08/21/2020   CO2 27 08/21/2020   GLUCOSE 226 (H) 08/21/2020   BUN 12 08/21/2020   CREATININE 0.90 08/21/2020   CALCIUM 8.6 (L) 08/21/2020   GFRNONAA >60 08/21/2020   Lipid Panel: No results found for: LDLCALC HgbA1c: No results found for: HGBA1C Urine Drug Screen: No results found for: LABOPIA, COCAINSCRNUR, LABBENZ, AMPHETMU, THCU, LABBARB  Alcohol Level No results found for: [facility]  CTH  1. Stable CT appearance of the brain since February with chronic infarcts in the left basal ganglia and left cerebellum. 2.  ASPECTS 10. 3. These results were communicated to Dr. Erlinda Hong at 10:09 am on 08/21/2020 by text page via the [facility] messaging system.  Impression   A 70 year old history with history of prior stroke with mild residual weakness in her right arm/face/leg, dementia, PD, CAD, DM2, HL who presents to ED as stroke code for episode of staring off into space with decreased responsiveness favored to be  focal seizure with impaired awareness. Now back to recent baseline. Given abnl CT head + clinical seizure will start on LEV. Routine EEG and MRI brain.  Recommendations   - Admit to hospitalist service - 1500mg  keppra now f/b 500mg  bid - MRI brain wo contrast - Routine  EEG - UA/UCx  Will continue to follow ______________________________________________________________________   Thank you for the opportunity to take part in the care of this patient. If you have any further questions, please contact the neurology consultation attending.  Signed,  Su Monks, MD [facility] [phone]  If 7pm- 7am, please page neurology on call as listed in [place].

## 2020-08-21 NOTE — Procedures (Signed)
Patient Name: Cheryl Morse  MRN: [id number]  Epilepsy Attending: Lora Havens  Referring Physician/Provider: Dr Su Monks Date: 08/21/2020 Duration: 24.53 mins  Patient history: 70 year old history with history of prior stroke with mild residual weakness in her right arm/face/leg, dementia, PD, CAD, DM2, HL who presents to ED as stroke code for episode of staring off into space with decreased responsiveness favored to be focal seizure with impaired awareness. EEG to evaluate for seizure  Level of alertness: Awake  AEDs during EEG study: LEV  Technical aspects: This EEG study was done with scalp electrodes positioned according to the 10-20 International system of electrode placement. Electrical activity was acquired at a sampling rate of 500Hz  and reviewed with a high frequency filter of 70Hz  and a low frequency filter of 1Hz . EEG data were recorded continuously and digitally stored.   Description: No posterior dominant rhythm was seen. There is an excessive amount of 15 to 18 Hz beta activity with irregular morphology distributed symmetrically and diffusely. Hyperventilation and photic stimulation were not performed.     ABNORMALITY - Excessive beta, generalized  IMPRESSION: This study is within normal limits. No seizures or epileptiform discharges were seen throughout the recording. The excessive beta activity seen in the background is most likely due to the effect of benzodiazepine and is a benign EEG pattern.   Sevilla Murtagh Barbra Sarks

## 2020-08-21 NOTE — Progress Notes (Signed)
EEG complete - results pending 

## 2020-08-21 NOTE — H&P (Addendum)
Date: 08/21/2020               Patient Name:  Cheryl Morse MRN: [id number]  DOB: 03-28-1951 Age / Sex: 70 y.o., female   PCP: Jeanette Caprice, PA-C              Medical Service: Internal Medicine Teaching Service              Attending Physician: Dr. Evette Doffing, Mallie Mussel, *    First Contact: Jacqlyn Larsen, MS4  Pager: [phone]  Second Contact: Dr. Maudie Mercury  Pager: [phone]       After Hours (After 5p/  First Contact Pager: [phone]  weekends / holidays): Second Contact Pager: [phone]   Chief Complaint: Unresponsive episode, stroke code   History of Present Illness: Cheryl Morse is a 70 y/o F with a PMH of prior stroke with residual weakness of R side of arm/face/leg, dementia, Parkinson's, DM, CAD, and HLD who to the ED after being found unresponsive at her facility. Cheryl Morse states that the last thing that she recalled was walking down the hallway to get breakfast. She endorsed feeling "funny" and endorsed pain in her R arm. She also noticed that she could not recall the nurses names or their faces this morning. The next thing she remembers was being placed in the ambulance. She has had seizures in the past after her stroke. Her last seizure occurred 1-2 months ago. She is adherent to all of her medications. She has not had an episode like this before.   Reports headache and pain in right arm this morning. Denies chest pain, palpitations, blurry vision, abdominal pain, change in bowel movements, difficulty or pain with urinating, and pain elsewhere.   Per facility staff last known normal was 0630 today. At Cheryl Morse was found by staff holding her R arm, not responding and not following commands. She remained staring off until EMS came but while transporting she became verbal again and returned to her baseline.   ED Course: Transported to ED via EMS as stroke code. NIHSS was 4 for mild weakness and sensory deficit in her right face arm and leg. All of these  deficits were chronic for her per patient. Vitals include afebrile at 98.1, BP 177/66, HR 71, resp 18, sating 99% on RA. CBG 200. Neurology team consulted. Non cont CT scan showed no acute intracranial process or hemorrhage. Stable old infarcts present. CTA not performed as patient had no new neurological deficits. Not a candidate for tPA. Lab work up unremarkable.   Daughter: Cheryl Morse: [phone]  Meds:  Current Meds  Medication Sig  . acetaminophen (TYLENOL) 500 MG tablet Take 500 mg by mouth See admin instructions. Take 500 mg by mouth three times a day and every six hours as needed for fevers of 99 to 101 F, minor headaches, or discomfort  . amLODipine (NORVASC) 10 MG tablet Take 10 mg by mouth daily.  . carbamazepine (TEGRETOL XR) 100 MG 12 hr tablet Take 100 mg by mouth 3 (three) times daily.  . carbidopa-levodopa (SINEMET IR) 25-100 MG tablet Take 1 tablet by mouth 3 (three) times daily.  . carvedilol (COREG) 6.25 MG tablet Take 1 tablet (6.25 mg total) by mouth 2 (two) times daily.  . clopidogrel (PLAVIX) 75 MG tablet Take 75 mg by mouth daily.  Marland Kitchen glipiZIDE (GLUCOTROL) 5 MG tablet Take 5 mg by mouth in the morning.  . insulin glargine (LANTUS SOLOSTAR) 100 UNIT/ML Solostar Pen Inject 5-30 Units into the  skin See admin instructions. Inject 30 units into the skin in the morning and 5 units in the evening  . Insulin NPH, Human,, Isophane, (HUMULIN N KWIKPEN) 100 UNIT/ML Kiwkpen Inject 4-10 Units into the skin See admin instructions. Inject 10 units into the skin at 6 AM as needed for a fasting BGL >450 and 4 units with lunch  . levothyroxine (SYNTHROID) 175 MCG tablet Take 175 mcg by mouth daily before breakfast.  . loperamide (IMODIUM) 2 MG capsule Take 2 mg by mouth See admin instructions. Take 2 mg by mouth as needed with each loose stool or diarrhea- not to exceed 8 doses/24 hours  . magnesium hydroxide (MILK OF MAGNESIA) 400 MG/5ML suspension Take 30 mLs by mouth at bedtime as  needed for mild constipation or moderate constipation.  . memantine (NAMENDA) 5 MG tablet Take 5 mg by mouth 2 (two) times daily.  Marland Kitchen MI-ACID 200-200-20 MG/5ML suspension Take 30 mLs by mouth every 6 (six) hours as needed for indigestion or heartburn.  . nitroGLYCERIN (NITROSTAT) 0.4 MG SL tablet Place 0.4 mg under the tongue See admin instructions. Dissolve 0.4 mg (1 tablet) under the tongue every SIX minutes as needed for chest pain- not to exceed 3 tablets  . pravastatin (PRAVACHOL) 40 MG tablet Take 40 mg by mouth at bedtime.  Marland Kitchen ROBAFEN 100 MG/5ML syrup Take 200 mg by mouth every 6 (six) hours as needed for cough.  . traMADol (ULTRAM) 50 MG tablet Take 50 mg by mouth 3 (three) times daily.  . traZODone (DESYREL) 150 MG tablet Take 150 mg by mouth at bedtime.   Allergies: Allergies as of 08/21/2020 - Review Complete 08/21/2020  Allergen Reaction Noted  . Nsaids Nausea Only and Other (See Comments) 10/19/2017  . Tomato Hives 09/22/2018  . Neosporin original [bacitracin-neomycin-polymyxin] Other (See Comments) 03/10/2020   Past Medical History:  Diagnosis Date  . Cerebrovascular disease   . Coronary artery disease   . Dementia ([place])   . Diabetes mellitus without complication ([place])   . Hyperlipidemia   . Parkinson disease ([place])     Family History: Unsure of Family Hx as parents have both passed.   Social History: No alcohol use, no tobacco products, and no other illicit drugs.   Review of Systems: A complete ROS was negative except as per HPI.  Physical Exam: Blood pressure (!) 189/79, pulse 85, temperature 98 F (36.7 C), temperature source Oral, resp. rate 18, SpO2 100 %.  Physical Exam Constitutional:      Appearance: Normal appearance.  HENT:     Head: Atraumatic.     Mouth/Throat:     Mouth: Mucous membranes are moist.     Pharynx: Oropharynx is clear.  Eyes:     Extraocular Movements: Extraocular movements intact.     Conjunctiva/sclera: Conjunctivae normal.      Pupils: Pupils are equal, round, and reactive to light.  Cardiovascular:     Rate and Rhythm: Normal rate and regular rhythm.     Heart sounds: Normal heart sounds.  Pulmonary:     Effort: Pulmonary effort is normal.     Breath sounds: Normal breath sounds.  Abdominal:     General: Abdomen is flat. Bowel sounds are normal.     Palpations: Abdomen is soft.  Skin:    General: Skin is warm and dry.  Neurological:     Mental Status: She is alert. Mental status is at baseline.     Comments: Patient has chronic R sided weakness of face,  arm, and leg. Chronic decreased sensation of R side of face, arm, and leg. These deficits are unchanged from baseline. No new focal deficits. No deficits on L side.   Psychiatric:        Mood and Affect: Mood normal.        Behavior: Behavior normal.    EKG: NSR, unchanged from prior EKG.   Non-contrast CT: IMPRESSION: 1. Stable CT appearance of the brain since February with chronic infarcts in the left basal ganglia and left cerebellum. 2.  ASPECTS 10. 3. These results were communicated to Dr. Erlinda Hong at 10:09 am on 08/21/2020 by text page via the [facility] messaging system.  Assessment & Plan by Problem: Active Problems:   Seizure-like activity (HCC)  Focal seizure with impaired awareness  Patient has history of stroke and seizures who presents with episode of unresponsiveness of staring off, post-ictal confusion, and presents to ED as stroke code. Differential includes CVA vs seizure. Given non-contrast CT shows no new changes and no new focal deficits, CVA is less likely. Most likely c/w focal seizure with impaired awareness. Laboratory work up unremarkable. Neurology following.   -Neurology following, appreciate recommendations  -Loaded with 1500 mg Keppra for seizure. 500 mg BID after loading dose is complete.  -MRI brain without contrast  -EEG  -NPO until swallow evaluation complete  -follow up urine and urine culture, TSH, A1c   Hypertension  BP  elevated upon admission, patient reports she is normotensive at home and adherent to medications at home. Hold home PO medications until passes swallow test.   Dementia Patient alert and back to baseline. Hold home PO medications until passes swallow test.   Parkinson's Disease  Hold home PO medications until passes swallow test.   T2DM  A1c pending. Hold home PO medications until passes swallow test.   HLD Hold home PO medications until passes swallow test.   Dispo: Admit patient to Observation with expected length of stay less than 2 midnights.  Signed: Maudie Mercury, MD 08/21/2020, 1:03 PM  Pager: [phone] After 5pm on weekdays and 1pm on weekends: On Call pager: [phone]  Attestation for Student Documentation:  I personally was present and performed or re-performed the history, physical exam and medical decision-making activities of this service and have verified that the service and findings are accurately documented in the student's note.  Maudie Mercury, MD 08/21/2020, 1:03 PM

## 2020-08-22 DIAGNOSIS — R569 Unspecified convulsions: Secondary | ICD-10-CM | POA: Diagnosis not present

## 2020-08-22 DIAGNOSIS — I10 Essential (primary) hypertension: Secondary | ICD-10-CM | POA: Diagnosis not present

## 2020-08-22 DIAGNOSIS — E039 Hypothyroidism, unspecified: Secondary | ICD-10-CM

## 2020-08-22 DIAGNOSIS — E785 Hyperlipidemia, unspecified: Secondary | ICD-10-CM

## 2020-08-22 DIAGNOSIS — G20 Parkinson's disease: Secondary | ICD-10-CM

## 2020-08-22 LAB — BASIC METABOLIC PANEL
Anion gap: 9 (ref 5–15)
BUN: 10 mg/dL (ref 8–23)
CO2: 26 mmol/L (ref 22–32)
Calcium: 8.6 mg/dL — ABNORMAL LOW (ref 8.9–10.3)
Chloride: 104 mmol/L (ref 98–111)
Creatinine, Ser: 0.82 mg/dL (ref 0.44–1.00)
GFR, Estimated: >60 mL/min (ref >60)
Glucose, Bld: 290 mg/dL — ABNORMAL HIGH (ref 70–99)
Potassium: 3.2 mmol/L — ABNORMAL LOW (ref 3.5–5.1)
Sodium: 139 mmol/L (ref 135–145)

## 2020-08-22 LAB — CBC
HCT: 30.1 % — ABNORMAL LOW (ref 36.0–46.0)
Hemoglobin: 10.1 g/dL — ABNORMAL LOW (ref 12.0–15.0)
MCH: 27.2 pg (ref 26.0–34.0)
MCHC: 33.6 g/dL (ref 30.0–36.0)
MCV: 80.9 fL (ref 80.0–100.0)
Platelets: 212 10*3/uL (ref 150–400)
RBC: 3.72 MIL/uL — ABNORMAL LOW (ref 3.87–5.11)
RDW: 14.2 % (ref 11.5–15.5)
WBC: 4.9 10*3/uL (ref 4.0–10.5)
nRBC: 0 % (ref 0.0–0.2)

## 2020-08-22 LAB — URINE CULTURE: Culture: NO GROWTH

## 2020-08-22 LAB — GLUCOSE, CAPILLARY
Glucose-Capillary: 210 mg/dL — ABNORMAL HIGH (ref 70–99)
Glucose-Capillary: 162 mg/dL — ABNORMAL HIGH (ref 70–99)

## 2020-08-22 MED ORDER — LEVETIRACETAM 500 MG PO TABS: 500.0000 mg | ORAL_TABLET | Freq: Two times a day (BID) | ORAL | 0 refills | Status: DC

## 2020-08-22 MED ORDER — LABETALOL HCL 5 MG/ML IV SOLN
10.0000 mg | Freq: Once | INTRAVENOUS | Status: AC
  Administered 2020-08-22: 10 mg via INTRAVENOUS
  Filled 2020-08-22: qty 4

## 2020-08-22 MED ORDER — INSULIN ASPART 100 UNIT/ML IJ SOLN
0.0000 [IU] | Freq: Three times a day (TID) | INTRAMUSCULAR | Status: DC
  Administered 2020-08-22: 2 [IU] via SUBCUTANEOUS
  Administered 2020-08-22: 3 [IU] via SUBCUTANEOUS

## 2020-08-22 MED ORDER — LEVETIRACETAM 500 MG PO TABS: 500.0000 mg | ORAL_TABLET | Freq: Two times a day (BID) | ORAL | 0 refills | Status: AC

## 2020-08-22 MED ORDER — TRAZODONE HCL 50 MG PO TABS
150.0000 mg | ORAL_TABLET | Freq: Every day | ORAL | Status: DC
  Administered 2020-08-22: 150 mg via ORAL
  Filled 2020-08-22: qty 1

## 2020-08-22 MED ORDER — POTASSIUM CHLORIDE CRYS ER 20 MEQ PO TBCR
40.0000 meq | EXTENDED_RELEASE_TABLET | Freq: Two times a day (BID) | ORAL | Status: DC
  Administered 2020-08-22: 40 meq via ORAL
  Filled 2020-08-22: qty 2

## 2020-08-22 MED ORDER — INSULIN GLARGINE 100 UNIT/ML ~~LOC~~ SOLN
13.0000 [IU] | Freq: Every day | SUBCUTANEOUS | Status: DC
  Filled 2020-08-22: qty 0.13

## 2020-08-22 NOTE — NC FL2 (Signed)
[place] LEVEL OF CARE SCREENING TOOL     IDENTIFICATION  Patient Name: Cheryl Morse Birthdate: [birth year] Sex: female Admission Date (Current Location): 08/21/2020  [facility] and [place] Number:  Herbalist and Address:  [facility] [place], [place] [address] [place], [place] [address]      Provider Number: [id number]  Attending Physician Name and Address:  No att. providers found  Relative Name and Phone Number:  Johnna Acosta, [phone]    Current Level of Care:  (ALF) Recommended Level of Care: [place] Prior Approval Number:    Date Approved/Denied:   PASRR Number:    Discharge Plan: Other (Comment) (ALF)    Current Diagnoses: Patient Active Problem List   Diagnosis Date Noted  . Seizure-like activity ([place]) 08/21/2020  . Coronary artery disease of native artery of native heart with stable angina pectoris ([place]) 05/25/2020  . Essential hypertension 05/25/2020  . Dementia ([place])   . Parkinson disease ([place])   . Cerebrovascular disease   . Acquired hypothyroidism 03/11/2017  . Anxiety 03/11/2017  . Type 2 diabetes mellitus with hyperglycemia, with long-term current use of insulin ([place]) 03/11/2017    Orientation RESPIRATION BLADDER Height & Weight     Self,Situation,Place  Normal Continent Weight:   Height:     BEHAVIORAL SYMPTOMS/MOOD NEUROLOGICAL BOWEL NUTRITION STATUS      Continent Diet (See DC summary)  AMBULATORY STATUS COMMUNICATION OF NEEDS Skin   Limited Assist Verbally Skin abrasions (Scratches on mid to upper back)                       Personal Care Assistance Level of Assistance  Bathing,Feeding,Dressing Bathing Assistance: Limited assistance Feeding assistance: Independent Dressing Assistance: Limited assistance     Functional Limitations Info  Sight,Hearing,Speech Sight Info: Adequate Hearing Info: Adequate Speech Info: Adequate    SPECIAL CARE FACTORS FREQUENCY  PT (By  licensed PT),OT (By licensed OT)     PT Frequency: 3x week OT Frequency: 3x week            Contractures Contractures Info: Not present    Additional Factors Info  Code Status,Allergies,Insulin Sliding Scale Code Status Info: DNR Allergies Info: Nsaids   Tomato   Neosporin Original (Bacitracin-neomycin-polymyxin)   Insulin Sliding Scale Info: Insulin Aspart (Novolog) 0-9 U 3x daily w/ meals; Insulin Glargine (Lantus) 13 U @ bedtime       Current Medications (08/22/2020):  This is the current hospital active medication list Current Facility-Administered Medications  Medication Dose Route Frequency Provider Last Rate Last Admin  . acetaminophen (TYLENOL) tablet 650 mg  650 mg Oral Q6H PRN Maudie Mercury, MD   650 mg at 08/22/20 1540   Or  . acetaminophen (TYLENOL) suppository 650 mg  650 mg Rectal Q6H PRN Maudie Mercury, MD      . amLODipine (NORVASC) tablet 10 mg  10 mg Oral Daily Lacinda Axon, MD   10 mg at 08/22/20 0854  . carbidopa-levodopa (SINEMET IR) 25-100 MG per tablet immediate release 1 tablet  1 tablet Oral TID Lacinda Axon, MD   1 tablet at 08/22/20 1652  . carvedilol (COREG) tablet 6.25 mg  6.25 mg Oral BID WC Lacinda Axon, MD   6.25 mg at 08/22/20 1652  . glipiZIDE (GLUCOTROL) tablet 5 mg  5 mg Oral Daily Lacinda Axon, MD   5 mg at 08/22/20 0856  . insulin aspart (novoLOG) injection 0-9 Units  0-9  Units Subcutaneous TID WC Maudie Mercury, MD   2 Units at 08/22/20 1226  . insulin glargine (LANTUS) injection 13 Units  13 Units Subcutaneous QHS Maudie Mercury, MD      . levETIRAcetam (KEPPRA) IVPB 1500 mg/ 100 mL premix  1,500 mg Intravenous Once Derek Jack, MD       Followed by  . levETIRAcetam (KEPPRA) tablet 500 mg  500 mg Oral BID Derek Jack, MD   500 mg at 08/22/20 0855  . levothyroxine (SYNTHROID) tablet 175 mcg  175 mcg Oral Q0600 Lacinda Axon, MD      . memantine [facility]) tablet 5 mg  5 mg Oral BID Lacinda Axon, MD   5 mg at 08/22/20 0854  . potassium chloride SA (KLOR-CON) CR tablet 40 mEq  40 mEq Oral BID Maudie Mercury, MD   40 mEq at 08/22/20 0856  . pravastatin (PRAVACHOL) tablet 40 mg  40 mg Oral QHS Lacinda Axon, MD      . senna-docusate (Senokot-S) tablet 1 tablet  1 tablet Oral QHS PRN Maudie Mercury, MD      . sodium chloride flush (NS) 0.9 % injection 3 mL  3 mL Intravenous Once Hayden Rasmussen, MD      . traZODone (DESYREL) tablet 150 mg  150 mg Oral QHS Lacinda Axon, MD   150 mg at 08/22/20 0113   Current Outpatient Medications  Medication Sig Dispense Refill  . acetaminophen (TYLENOL) 500 MG tablet Take 500 mg by mouth See admin instructions. Take 500 mg by mouth three times a day and every six hours as needed for fevers of 99 to 101 F, minor headaches, or discomfort    . amLODipine (NORVASC) 10 MG tablet Take 10 mg by mouth daily.    . carbidopa-levodopa (SINEMET IR) 25-100 MG tablet Take 1 tablet by mouth 3 (three) times daily.    . carvedilol (COREG) 6.25 MG tablet Take 1 tablet (6.25 mg total) by mouth 2 (two) times daily. 180 tablet 3  . clopidogrel (PLAVIX) 75 MG tablet Take 75 mg by mouth daily.    Marland Kitchen glipiZIDE (GLUCOTROL) 5 MG tablet Take 5 mg by mouth in the morning.    . insulin glargine (LANTUS SOLOSTAR) 100 UNIT/ML Solostar Pen Inject 5-30 Units into the skin See admin instructions. Inject 30 units into the skin in the morning and 5 units in the evening    . Insulin NPH, Human,, Isophane, (HUMULIN N KWIKPEN) 100 UNIT/ML Kiwkpen Inject 4-10 Units into the skin See admin instructions. Inject 10 units into the skin at 6 AM as needed for a fasting BGL >450 and 4 units with lunch    . levETIRAcetam (KEPPRA) 500 MG tablet Take 1 tablet (500 mg total) by mouth 2 (two) times daily. 60 tablet 0  . levothyroxine (SYNTHROID) 175 MCG tablet Take 175 mcg by mouth daily before breakfast.    . memantine (NAMENDA) 5 MG tablet Take 5 mg by mouth 2 (two) times daily.     Marland Kitchen MI-ACID 200-200-20 MG/5ML suspension Take 30 mLs by mouth every 6 (six) hours as needed for indigestion or heartburn.    . nitroGLYCERIN (NITROSTAT) 0.4 MG SL tablet Place 0.4 mg under the tongue See admin instructions. Dissolve 0.4 mg (1 tablet) under the tongue every SIX minutes as needed for chest pain- not to exceed 3 tablets    . pravastatin (PRAVACHOL) 40 MG tablet Take 40 mg by mouth at bedtime.    Marland Kitchen  ROBAFEN 100 MG/5ML syrup Take 200 mg by mouth every 6 (six) hours as needed for cough.    . traMADol (ULTRAM) 50 MG tablet Take 50 mg by mouth 3 (three) times daily.    . traZODone (DESYREL) 150 MG tablet Take 150 mg by mouth at bedtime.    . ACETAMINOPHEN PO Take 500 mg by mouth in the morning, at noon, and at bedtime.    . carbidopa-levodopa (PARCOPA) 25-100 MG disintegrating tablet Take 1 tablet by mouth 3 (three) times daily.       Discharge Medications: Please see discharge summary for a list of discharge medications.  Relevant Imaging Results:  Relevant Lab Results:   Additional Information SS# [id number], [place]

## 2020-08-22 NOTE — Care Management Obs Status (Signed)
[place] NOTIFICATION   Patient Details  Name: Cheryl Morse MRN: [id number] Date of Birth: 11/23/1950   Medicare Observation Status Notification Given:  Yes    Carles Collet, RN 08/22/2020, 4:09 PM

## 2020-08-22 NOTE — Discharge Instructions (Signed)
To Cheryl Morse,  It was a pleasure taking care of you during your stay at [facility]. You were admitted for possible seizure activity. You were started on a new medication, Keppra, that you should take two time daily to prevent seizures. Please follow up with your outpatient physician for the management of your new medications. If you should have another event please come back to the nearest emergency room for further evaluation.

## 2020-08-22 NOTE — Progress Notes (Signed)
SLP Cancellation Note  Patient Details Name: Cheryl Morse MRN: [id number] DOB: October 17, 1950   Cancelled treatment:       Reason Eval/Treat Not Completed: SLP screened, no needs identified, will sign off  Pt passed [facility] swallow screen and Cheryl Mola, RN denied observance of any subsequent swallowing difficulty; therefore, no formalized SLP swallow eval is needed per protocol. Will sign off.    Cheryl Morse, [place], [place] Office number [phone] Pager [phone]    Horton Marshall 08/22/2020, 2:18 PM

## 2020-08-22 NOTE — Discharge Summary (Signed)
Name: Cheryl Morse MRN: [id number] DOB: 06-25-1950 70 y.o. PCP: Cheryl Caprice, PA-C  Date of Admission: 08/21/2020  9:48 AM Date of Discharge: 08/22/2020 Attending Physician: Cheryl Brothers, MD  Discharge Diagnosis: 1. Seizure like Activity  2. Hypertension 3. Parkinson's Disease 4. Hypothyroidism 5. HLD    Discharge Medications: Allergies as of 08/22/2020      Reactions   Nsaids Nausea Only, Other (See Comments)   GI upset   Tomato Hives   Neosporin Original [bacitracin-neomycin-polymyxin] Other (See Comments)   "Allergic," per [facility]      Medication List    STOP taking these medications   carbamazepine 100 MG 12 hr tablet Commonly known as: TEGRETOL XR   lidocaine 5 % Commonly known as: Lidoderm   loperamide 2 MG capsule Commonly known as: IMODIUM   magnesium hydroxide 400 MG/5ML suspension Commonly known as: MILK OF MAGNESIA     TAKE these medications   ACETAMINOPHEN PO Take 500 mg by mouth in the morning, at noon, and at bedtime.   acetaminophen 500 MG tablet Commonly known as: TYLENOL Take 500 mg by mouth See admin instructions. Take 500 mg by mouth three times a day and every six hours as needed for fevers of 99 to 101 F, minor headaches, or discomfort   amLODipine 10 MG tablet Commonly known as: NORVASC Take 10 mg by mouth daily.   carbidopa-levodopa 25-100 MG disintegrating tablet Commonly known as: PARCOPA Take 1 tablet by mouth 3 (three) times daily.   carbidopa-levodopa 25-100 MG tablet Commonly known as: SINEMET IR Take 1 tablet by mouth 3 (three) times daily.   carvedilol 6.25 MG tablet Commonly known as: COREG Take 1 tablet (6.25 mg total) by mouth 2 (two) times daily.   clopidogrel 75 MG tablet Commonly known as: PLAVIX Take 75 mg by mouth daily.   glipiZIDE 5 MG tablet Commonly known as: GLUCOTROL Take 5 mg by mouth in the morning.   HumuLIN N KwikPen 100 UNIT/ML Kiwkpen Generic drug: Insulin NPH (Human)  (Isophane) Inject 4-10 Units into the skin See admin instructions. Inject 10 units into the skin at 6 AM as needed for a fasting BGL >450 and 4 units with lunch   Lantus SoloStar 100 UNIT/ML Solostar Pen Generic drug: insulin glargine Inject 5-30 Units into the skin See admin instructions. Inject 30 units into the skin in the morning and 5 units in the evening   levETIRAcetam 500 MG tablet Commonly known as: Keppra Take 1 tablet (500 mg total) by mouth 2 (two) times daily.   levothyroxine 175 MCG tablet Commonly known as: SYNTHROID Take 175 mcg by mouth daily before breakfast.   memantine 5 MG tablet Commonly known as: NAMENDA Take 5 mg by mouth 2 (two) times daily.   Mi-Acid 200-200-20 MG/5ML suspension Generic drug: alum & mag hydroxide-simeth Take 30 mLs by mouth every 6 (six) hours as needed for indigestion or heartburn. What changed: Another medication with the same name was removed. Continue taking this medication, and follow the directions you see here.   nitroGLYCERIN 0.4 MG SL tablet Commonly known as: NITROSTAT Place 0.4 mg under the tongue See admin instructions. Dissolve 0.4 mg (1 tablet) under the tongue every SIX minutes as needed for chest pain- not to exceed 3 tablets   pravastatin 40 MG tablet Commonly known as: PRAVACHOL Take 40 mg by mouth at bedtime.   Robafen 100 MG/5ML syrup Generic drug: guaifenesin Take 200 mg by mouth every 6 (six) hours as needed for cough. What  changed: Another medication with the same name was removed. Continue taking this medication, and follow the directions you see here.   traMADol 50 MG tablet Commonly known as: ULTRAM Take 50 mg by mouth 3 (three) times daily.   traZODone 150 MG tablet Commonly known as: DESYREL Take 150 mg by mouth at bedtime.       Disposition and follow-up:   Cheryl Morse was discharged from [facility] in Stable condition.  At the Morse follow up visit please  address:  1.  Seizure like Activity   - Continued compliance with her Keppra 500 mg twice daily  2. Hypertension  - continue Coreg 6.25 mg BID and amlodipine 10 mg daily  3. Parkinson's Disease  - Continue Sinemet IR 25-100 mg TID and Namenda 5 mg BID 4. Hypothyroidism  - Continue Synthroid 175 mg daily    5. HLD  - Continue Pravachol 40 mg nightly  2.  Labs / imaging needed at time of follow-up:  None  3.  Pending labs/ test needing follow-up: None  Follow-up Appointments:  Follow-up Information    Kurth-Bowen, Cornelia, PA-C. Schedule an appointment as soon as possible for a visit in 1 week(s).   Specialty: Physician Assistant Contact information: Cheryl [facility] [address] [address] [facility] Course by problem list: 1. Seizure Like Activity  Patient presented to the [facility] after being found by staff at [facility] holding her right arm and was not responding or following commands. She remained staring off until EMS came and became verbal en route to [facility]. She underwent a CT Head which did not show acute processes. Her labs were unremarkable. She was meant to undergo an MRI head, but patient refused two times after discussing the benefits and risks of the scan. Her EEG came back not showing any active seizure activity. She was given a loading dose of IV Keppra, and started on Keppra 500 mg BID. She was discharged in stable condition.   2. Hypertension Patient presented to the ED with elevated pressures with systolics in low 371I. She states that her pressures had been increasing at home, but was unsure how high it was. She required one dose of IV labetalol for increased pressures. After passing a swallow eval, patient was started back on her home medications with reduction in her pressures with systolic readings in the 967E. She was discharged in stable condition on her home regimen  3. Parkinson's Disease Patient was maintained on her  home regimen once she passed her swallow evaluation. She was discharged in stable condition on her home regimen.   4. Hypothyroidism Patient was kept on her home Synthroid after passing her swallow evaluation. She was discharged home in stable condition on her home regimen.   5. HLD Patient continued her home Pravachol after passing her swallow evaluation.   Discharge Exam:   BP (!) 153/69 (BP Location: Left Arm)   Pulse 74   Temp 98.2 F (36.8 C) (Oral)   Resp 18   SpO2 100%  Discharge exam:  Physical Exam Constitutional:      General: She is not in acute distress.    Appearance: Normal appearance. She is not ill-appearing or toxic-appearing.  Cardiovascular:     Rate and Rhythm: Normal rate and regular rhythm.     Pulses: Normal pulses.     Heart sounds: Normal heart sounds. No murmur heard. No friction rub.  No gallop.   Pulmonary:     Effort: Pulmonary effort is normal.     Breath sounds: No wheezing, rhonchi or rales.  Neurological:     Mental Status: She is alert and oriented to person, place, and time.  Psychiatric:        Mood and Affect: Mood normal.        Behavior: Behavior normal.     Pertinent Labs, Studies, and Procedures:  EXAM: CT HEAD WITHOUT CONTRAST  TECHNIQUE: Contiguous axial images were obtained from the base of the skull through the vertex without intravenous contrast.  COMPARISON:  Brain MRI 05/17/2019.  Head CT 06/01/2020.  FINDINGS: Brain: Probable chronic lacunar infarct left basal ganglia and internal capsule is stable. Small chronic infarct in the left inferior cerebellum is stable.  Gray-white matter differentiation elsewhere is stable and within normal limits. Chronic brainstem volume loss. No midline shift, ventriculomegaly, mass effect, evidence of mass lesion, intracranial hemorrhage or evidence of cortically based acute infarction.  Vascular: Calcified atherosclerosis at the skull base. No suspicious intracranial vascular  hyperdensity.  Skull: Stable, negative.  Sinuses/Orbits: Visualized paranasal sinuses and mastoids are stable and well aerated.  Other: Visualized orbits and scalp soft tissues are negative aside from chronic left suboccipital scalp scarring.  ASPECTS [facility] Stroke Program Early CT Score)  Total score (0-10 with 10 being normal): 10 (chronic encephalomalacia).  IMPRESSION: 1. Stable CT appearance of the brain since February with chronic infarcts in the left basal ganglia and left cerebellum. 2.  ASPECTS 10. 3. These results were communicated to Dr. Erlinda Hong at 10:09 am on 08/21/2020 by text page via the [facility] messaging system.  CBC Latest Ref Rng & Units 08/22/2020 08/21/2020 08/21/2020  WBC 4.0 - 10.5 K/uL 4.9 - 3.9(L)  Hemoglobin 12.0 - 15.0 g/dL 10.1(L) 9.5(L) 10.2(L)  Hematocrit 36.0 - 46.0 % 30.1(L) 28.0(L) 30.8(L)  Platelets 150 - 400 K/uL 212 - 210    BMP Latest Ref Rng & Units 08/22/2020 08/21/2020 08/21/2020  Glucose 70 - 99 mg/dL 290(H) 226(H) 227(H)  BUN 8 - 23 mg/dL 10 12 11   Creatinine 0.44 - 1.00 mg/dL 0.82 0.90 0.99  Sodium 135 - 145 mmol/L 139 140 140  Potassium 3.5 - 5.1 mmol/L 3.2(L) 3.5 3.5  Chloride 98 - 111 mmol/L 104 104 105  CO2 22 - 32 mmol/L 26 - 27  Calcium 8.9 - 10.3 mg/dL 8.6(L) - 8.6(L)   Component 1 d ago  Specimen Description URINE, RANDOM   Special Requests NONE   Culture NO GROWTH  Performed at [facility], [address], [place], [place] [address]   Report Status 08/22/2020 FINAL    Component Ref Range & Units 1 d ago  (08/21/20) 2 mo ago  (06/01/20) 2 mo ago  (06/01/20)  Color, Urine YELLOW STRAWAbnormal   YELLOW   APPearance CLEAR CLEAR   HAZYAbnormal   Specific Gravity, Urine 1.005 - 1.030 1.008   1.025   pH 5.0 - 8.0 7.0   6.5   Glucose, UA NEGATIVE mg/dL 50Abnormal   NEGATIVE   Hgb urine dipstick NEGATIVE NEGATIVE   NEGATIVE   Bilirubin Urine NEGATIVE NEGATIVE   NEGATIVE   Ketones, ur NEGATIVE mg/dL NEGATIVE   NEGATIVE    Protein, ur NEGATIVE mg/dL 100Abnormal   >300Abnormal   Nitrite NEGATIVE NEGATIVE   NEGATIVE   Leukocytes,Ua NEGATIVE NEGATIVE   NEGATIVE CM   RBC / HPF 0 - 5 RBC/hpf 0-5  0-5    WBC, UA 0 -  5 WBC/hpf 0-5  0-5    Bacteria, UA NONE SEEN RAREAbnormal  RAREAbnormal     EEG: Level of alertness: Awake  AEDs during EEG study: LEV  Technical aspects: This EEG study was done with scalp electrodes positioned according to the 10-20 International system of electrode placement. Electrical activity was acquired at a sampling rate of 500Hz  and reviewed with a high frequency filter of 70Hz  and a low frequency filter of 1Hz . EEG data were recorded continuously and digitally stored.   Description: No posterior dominant rhythm was seen. There is an excessive amount of 15 to 18 Hz beta activity with irregular morphology distributed symmetrically and diffusely. Hyperventilation and photic stimulation were not performed.     ABNORMALITY - Excessive beta, generalized  IMPRESSION: This study is within normal limits. No seizures or epileptiform discharges were seen throughout the recording. The excessive beta activity seen in the background is most likely due to the effect of benzodiazepine and is a benign EEG pattern.   Discharge Instructions: Discharge Instructions    Call MD for:  extreme fatigue   Complete by: As directed    Call MD for:  persistant dizziness or light-headedness   Complete by: As directed    Call MD for:  persistant nausea and vomiting   Complete by: As directed    Diet - low sodium heart healthy   Complete by: As directed    Increase activity slowly   Complete by: As directed       Signed: Maudie Mercury, MD 08/22/2020, 5:57 PM   Pager: [phone]

## 2020-08-22 NOTE — Progress Notes (Addendum)
Subjective:  ON Events: Passed 2nd swallow screen, PO home meds started.   C[facility]yl [facility] was seen this morning. Patient states that she is doing well. She states that she vaguely remembers the team from yesterday. She endorses having had events similar to yesterday for the past several months, but attributes these events due to being "old." She states that she feels fine today and is eager to return to her ALF.   Objective:  Vital signs in last 24 hours: Vitals:   08/21/20 2011 08/21/20 2305 08/22/20 0232 08/22/20 0334  BP: (!) 183/93 (!) 187/68 (!) 179/73 (!) 167/77  Pulse: 93 85 71 73  Resp:  19  20  Temp:  98.2 F (36.8 C)  97.8 F (36.6 C)  TempSrc:  Oral  Oral  SpO2:  100%  100%   CBC Latest Ref Rng & Units 08/22/2020 08/21/2020 08/21/2020  WBC 4.0 - 10.5 K/uL 4.9 - 3.9(L)  Hemoglobin 12.0 - 15.0 g/dL 10.1(L) 9.5(L) 10.2(L)  Hematocrit 36.0 - 46.0 % 30.1(L) 28.0(L) 30.8(L)  Platelets 150 - 400 K/uL 212 - 210   BMP Latest Ref Rng & Units 08/22/2020 08/21/2020 08/21/2020  Glucose 70 - 99 mg/dL 290(H) 226(H) 227(H)  BUN 8 - 23 mg/dL 10 12 11   Creatinine 0.44 - 1.00 mg/dL 0.82 0.90 0.99  Sodium 135 - 145 mmol/L 139 140 140  Potassium 3.5 - 5.1 mmol/L 3.2(L) 3.5 3.5  Chloride 98 - 111 mmol/L 104 104 105  CO2 22 - 32 mmol/L 26 - 27  Calcium 8.9 - 10.3 mg/dL 8.6(L) - 8.6(L)    Assessment/Plan:  Active Problems:   Seizure-like activity (HCC)  Seizure like Activity:  Patient has history of stroke and seizures who presents with episode of unresponsiveness of staring off, post-ictal confusion, and presents to ED as stroke code. Non-contrast CT shows no new changes and no new focal deficits, CVA is less likely. EEG came back wnl, no seizures or epileptiform discharges seen. Most likely c/w focal seizure with impaired awareness. Laboratory work up unremarkable. Patient has refused MRI twice now, did have a discussion that the study is needed to assess for new bleeds, masses, or other  etiologies. Patient voices understanding, but refused MRI.  - Stable for discharge today, awaiting further recommendations.  - Neurology following, appreciate recommendations  - Keppra 500 mg BID - Frequent neuro checks - UA: unremarkable - Urine culture: in process  Hypertension  Required IV labetalol once ON for systolic of 725. Since starting her medications her BP has improved to 154/82 - Amlodipine 10 - COREG 6.25 mg BID  Dementia At baseline.  Parkinson's Disease  - Sinemet IR 25-100 mg TID - Namenda 5 mg BID   T2DM  Takes glipizide at home.  -SSI -Lantus 13U  Hypothyroidism -TSH: 0.022 -Synthroid 175 mg  HLD - Pravachol 40 mg nightly  Prior to Admission Living Arrangement: ALF ([facility]) Anticipated Discharge Location: ALF Barriers to Discharge: Medical workup Dispo: Anticipated discharge in approximately 0-1 day(s).   Maudie Mercury, MD 08/22/2020, 5:50 AM Pager: [phone] After 5pm on weekdays and 1pm on weekends: On Call pager [phone]   Internal Medicine Attending:   I saw and examined the patient. I reviewed the resident's note and I agree with the resident's findings and plan as documented in the resident's note.  Hospital day 1 for this 70 year old person with Parkinson's dementia admitted with seizure like activity.  Doing well this morning, no longer postictal, gave [place] more history about seizure disorder  as a young person and several months of these recurring spells.  Tolerating low-dose Keppra well.  Greatly appreciate neurology consultation.  Patient has declined to get an MRI twice, would be nice to have this completed to rule out small CVA or mass.  We will update her family, has a sister in [place], anticipating discharge back to her skilled nursing facility today or tomorrow.  Cheryl Brothers, MD

## 2020-08-22 NOTE — Progress Notes (Signed)
Patient Cheryl [facility] was found crying due to her frustration of wanting bedtime sleeping medication. When asked, she explained she usually takes Trazodone. Will page IMTS about this matter and also for a recommendation for possible PRN BP medication for High BP.

## 2020-08-22 NOTE — Progress Notes (Signed)
Neurology Update  EEG WNL. We recommended repeat MRI brain but patient declines. If she is not amenable to MRI, may d/c home on 500mg  keppra bid and f/u with her outpatient neurologist.  Su Monks, MD [facility] [phone]  If 7pm- 7am, please page neurology on call as listed in [place].

## 2020-08-23 LAB — HIV ANTIBODY (ROUTINE TESTING W REFLEX): HIV Screen 4th Generation wRfx: NONREACTIVE

## 2020-09-09 ENCOUNTER — Other Ambulatory Visit: Payer: Self-pay

## 2020-09-09 ENCOUNTER — Encounter: Payer: Self-pay | Admitting: Internal Medicine

## 2020-09-09 ENCOUNTER — Ambulatory Visit (INDEPENDENT_AMBULATORY_CARE_PROVIDER_SITE_OTHER): Payer: Medicare Other | Admitting: Internal Medicine

## 2020-09-09 VITALS — BP 130/68 | HR 65 | Ht 65.0 in | Wt 142.0 lb

## 2020-09-09 DIAGNOSIS — E1165 Type 2 diabetes mellitus with hyperglycemia: Secondary | ICD-10-CM | POA: Diagnosis not present

## 2020-09-09 DIAGNOSIS — I10 Essential (primary) hypertension: Secondary | ICD-10-CM | POA: Diagnosis not present

## 2020-09-09 DIAGNOSIS — I25118 Atherosclerotic heart disease of native coronary artery with other forms of angina pectoris: Secondary | ICD-10-CM

## 2020-09-09 DIAGNOSIS — I679 Cerebrovascular disease, unspecified: Secondary | ICD-10-CM | POA: Diagnosis not present

## 2020-09-09 DIAGNOSIS — Z794 Long term (current) use of insulin: Secondary | ICD-10-CM

## 2020-09-09 NOTE — Progress Notes (Signed)
Cardiology Office Note:    Date:  09/09/2020   ID:  Cheryl Morse, DOB 1950-12-12, MRN [id number]  PCP:  Jeanette Caprice, PA-C  [facility] Cardiologist:  Rudean Haskell MD  [place] Electrophysiologist:  None   CC: Follow up Chest pain  History of Present Illness:    Cheryl Morse is a 70 y.o. female with a hx of CAD NOS, DM with HLD, Parkinons's disease with associated Dementia possibly related to Parkinsons Disease who presents for evaluation 05/25/20.  At last visit we discussed her GOCs, which was to play bingo live her life.  She was oriented to person, place, and time at this encounter.  Planned for anti-angial therapy and conservative treatment  Seen 09/09/20.  Patient notes that she is doing well.  Since last visit notes changes.  . Last ED visit 08/21/20- Unresponsive episodes with seizures; HTN emergency off of medications (swallowing issues) SBP improved on home medications.  No chest pain or pressure.  No SOB/DOE and no PND/Orthopnea.  No weight gain or leg swelling.  No palpitations.  Been making jewelry and playing bingo. Made a clear, white, and red necklace.   Past Medical History:  Diagnosis Date  . Cerebrovascular disease   . Coronary artery disease   . Dementia ([place])   . Diabetes mellitus without complication ([place])   . Hyperlipidemia   . Parkinson disease [facility])     Past Surgical History:  Procedure Laterality Date  . APPENDECTOMY    . EXCISION MASS HEAD    . EXPLORATORY LAPAROTOMY    . THYROIDECTOMY    . TONSILLECTOMY    . TUBAL LIGATION      Current Medications: Current Meds  Medication Sig  . acetaminophen (TYLENOL) 500 MG tablet Take 500 mg by mouth See admin instructions. Take 500 mg by mouth three times a day and every six hours as needed for fevers of 99 to 101 F, minor headaches, or discomfort  . amLODipine (NORVASC) 10 MG tablet Take 10 mg by mouth daily.  . carbidopa-levodopa (PARCOPA) 25-100 MG disintegrating tablet Take  1 tablet by mouth 3 (three) times daily.  . carvedilol (COREG) 6.25 MG tablet Take 1 tablet (6.25 mg total) by mouth 2 (two) times daily.  . clopidogrel (PLAVIX) 75 MG tablet Take 75 mg by mouth daily.  Marland Kitchen glipiZIDE (GLUCOTROL) 5 MG tablet Take 5 mg by mouth in the morning.  . insulin glargine (LANTUS SOLOSTAR) 100 UNIT/ML Solostar Pen Inject 5-30 Units into the skin See admin instructions. Inject 30 units into the skin in the morning and 5 units in the evening  . Insulin NPH, Human,, Isophane, (HUMULIN N KWIKPEN) 100 UNIT/ML Kiwkpen Inject 4-10 Units into the skin See admin instructions. Inject 10 units into the skin at 6 AM as needed for a fasting BGL >450 and 4 units with lunch  . levETIRAcetam (KEPPRA) 500 MG tablet Take 1 tablet (500 mg total) by mouth 2 (two) times daily.  Marland Kitchen levothyroxine (SYNTHROID) 175 MCG tablet Take 175 mcg by mouth daily before breakfast.  . memantine (NAMENDA) 5 MG tablet Take 5 mg by mouth 2 (two) times daily.  Marland Kitchen MI-ACID 200-200-20 MG/5ML suspension Take 30 mLs by mouth every 6 (six) hours as needed for indigestion or heartburn.  . nitroGLYCERIN (NITROSTAT) 0.4 MG SL tablet Place 0.4 mg under the tongue See admin instructions. Dissolve 0.4 mg (1 tablet) under the tongue every SIX minutes as needed for chest pain- not to exceed 3 tablets  . pravastatin (PRAVACHOL)  40 MG tablet Take 40 mg by mouth at bedtime.  Marland Kitchen ROBAFEN 100 MG/5ML syrup Take 200 mg by mouth every 6 (six) hours as needed for cough.  . traMADol (ULTRAM) 50 MG tablet Take 50 mg by mouth 3 (three) times daily.  . traZODone (DESYREL) 150 MG tablet Take 150 mg by mouth at bedtime.     Allergies:   Nsaids, Tomato, and Neosporin original [bacitracin-neomycin-polymyxin]   Social History   Socioeconomic History  . Marital status: Widowed    Spouse name: Not on file  . Number of children: Not on file  . Years of education: Not on file  . Highest education level: Not on file  Occupational History  . Not on  file  Tobacco Use  . Smoking status: Former Smoker    Packs/day: 1.00    Years: 60.00    Pack years: 60.00    Types: Cigarettes    Quit date: 09/09/2008    Years since quitting: 12.0  . Smokeless tobacco: Never Used  Substance and Sexual Activity  . Alcohol use: Never  . Drug use: Never  . Sexual activity: Not on file  Other Topics Concern  . Not on file  Social History Narrative  . Not on file   Social Determinants of Health   Financial Resource Strain: Not on file  Food Insecurity: Not on file  Transportation Needs: Not on file  Physical Activity: Not on file  Stress: Not on file  Social Connections: Not on file    SOCIAL:  Sister in [place] helps make medical decisions.  Lives at [facility]  Family History: The patient's family history includes COPD in her mother; Heart attack in her father.  History of coronary artery disease notable for father and mother. History of heart failure notable for no members, but older sister had valve surgery. History of arrhythmia notable for no members.  ROS:   Please see the history of present illness.     All other systems reviewed and are negative.  EKGs/Labs/Other Studies Reviewed:    The following studies were reviewed today:  EKG:   04/28/20: SR rate 76, borderline criteria for septal infarct  Recent Labs: 08/21/2020: ALT 12; TSH 0.022 08/22/2020: BUN 10; Creatinine, Ser 0.82; Hemoglobin 10.1; Platelets 212; Potassium 3.2; Sodium 139  Recent Lipid Panel No results found for: CHOL, TRIG, HDL, CHOLHDL, VLDL, LDLCALC, LDLDIRECT   Risk Assessment/Calculations:     N/A  Physical Exam:    VS:  BP 130/68   Pulse 65   Ht 5\' 5"  (1.651 m)   Wt 64.4 kg   BMI 23.63 kg/m     Wt Readings from Last 3 Encounters:  09/09/20 64.4 kg  05/25/20 65.2 kg  03/25/20 61.2 kg    GEN:  Well nourished, well developed in no acute distress HEENT: Normal NECK: No JVD; No carotid bruits LYMPHATICS: No  lymphadenopathy CARDIAC: RRR, no murmurs, rubs, gallops RESPIRATORY:  Clear to auscultation without rales, wheezing or rhonchi  ABDOMEN: Soft, non-tender, non-distended MUSCULOSKELETAL:  No edema; No deformity  SKIN: Warm and dry NEUROLOGIC:  Alert and oriented x 3 PSYCHIATRIC:  Normal affect   ASSESSMENT:    1. Coronary artery disease of native artery of native heart with stable angina pectoris ([place])   2. Essential hypertension   3. Cerebrovascular disease   4. Type 2 diabetes mellitus with hyperglycemia, with long-term current use of insulin (HCC)    PLAN:    Probable Coronary  Artery Disease with stable angina HLD w DM Parkinson's Disease with Dementia and CT evidence of prior strokes HTN -asymptomatic  - anatomy: Unclear - continue plavix 75 mg PO daily - reasonable to continue statin if it causes no side effects (discussed at length with patient, who thinks we should keep taking it because she feels fine) pravastatin 40 mg PO Daily - continue norvasc 10, Coreg 6.25 mg PO BID - continue PRN Nitro  SHARED DECISION MAKING: GOC is med management only; discussed in depth in prior notes, no change  One year follow up unless new symptoms or abnormal test results warranting change in plan  Would be reasonable for  APP Follow up  Time Spent Directly with Patient:   I have spent a total of 40 minutes with the patient reviewing notes, imaging, EKGs, labs and examining the patient as well as establishing an assessment and plan that was discussed personally with the patient.  > 50% of time was spent in direct patient care and with [place] completed for family.   Medication Adjustments/Labs and Tests Ordered: Current medicines are reviewed at length with the patient today.  Concerns regarding medicines are outlined above.  No orders of the defined types were placed in this encounter.  No orders of the defined types were placed in this encounter.   Patient  Instructions  Medication Instructions:  Your physician recommends that you continue on your current medications as directed. Please refer to the Current Medication list given to you today.  *If you need a refill on your cardiac medications before your next appointment, please call your pharmacy*   Lab Work: NONE If you have labs (blood work) drawn today and your tests are completely normal, you will receive your results only by: Marland Kitchen MyChart Message (if you have MyChart) OR . A paper copy in the mail If you have any lab test that is abnormal or we need to change your treatment, we will call you to review the results.   Testing/Procedures: NONE   Follow-Up: At [facility], you and your health needs are our priority.  As part of our continuing mission to provide you with exceptional heart care, we have created designated Provider Care Teams.  These Care Teams include your primary Cardiologist (physician) and Advanced Practice Providers (APPs -  Physician Assistants and Nurse Practitioners) who all work together to provide you with the care you need, when you need it.  We recommend signing up for the patient portal called "MyChart".  Sign up information is provided on this After Visit Summary.  MyChart is used to connect with patients for Virtual Visits (Telemedicine).  Patients are able to view lab/test results, encounter notes, upcoming appointments, etc.  Non-urgent messages can be sent to your provider as well.   To learn more about what you can do with MyChart, go to [email or web address].    Your next appointment:   12 month(s)  The format for your next appointment:   In Person  Provider:   You may see Rudean Haskell, MD or one of the following Advanced Practice Providers on your designated Care Team:    Melina Copa, PA-C  Ermalinda Barrios, PA-C         Signed, Werner Lean, MD  09/09/2020 12:05 PM    [place]

## 2020-09-09 NOTE — Patient Instructions (Signed)
Medication Instructions:  Your physician recommends that you continue on your current medications as directed. Please refer to the Current Medication list given to you today.  *If you need a refill on your cardiac medications before your next appointment, please call your pharmacy*   Lab Work: NONE If you have labs (blood work) drawn today and your tests are completely normal, you will receive your results only by: . MyChart Message (if you have MyChart) OR . A paper copy in the mail If you have any lab test that is abnormal or we need to change your treatment, we will call you to review the results.   Testing/Procedures: NONE   Follow-Up: At [facility], you and your health needs are our priority.  As part of our continuing mission to provide you with exceptional heart care, we have created designated Provider Care Teams.  These Care Teams include your primary Cardiologist (physician) and Advanced Practice Providers (APPs -  Physician Assistants and Nurse Practitioners) who all work together to provide you with the care you need, when you need it.  We recommend signing up for the patient portal called "MyChart".  Sign up information is provided on this After Visit Summary.  MyChart is used to connect with patients for Virtual Visits (Telemedicine).  Patients are able to view lab/test results, encounter notes, upcoming appointments, etc.  Non-urgent messages can be sent to your provider as well.   To learn more about what you can do with MyChart, go to [email or web address].    Your next appointment:   12 month(s)  The format for your next appointment:   In Person  Provider:   You may see Mahesh Chandrasekhar, MD or one of the following Advanced Practice Providers on your designated Care Team:    Dayna Dunn, PA-C  Michele Lenze, PA-C        

## 2021-01-19 ENCOUNTER — Other Ambulatory Visit: Payer: Self-pay

## 2021-01-19 ENCOUNTER — Emergency Department (HOSPITAL_COMMUNITY)
Admission: EM | Admit: 2021-01-19 | Discharge: 2021-01-20 | Disposition: A | Discharge status: Home / Self Care | Payer: Medicare Other | Attending: Emergency Medicine | Admitting: Emergency Medicine

## 2021-01-19 ENCOUNTER — Emergency Department (HOSPITAL_COMMUNITY): Payer: Medicare Other

## 2021-01-19 DIAGNOSIS — S0003XA Contusion of scalp, initial encounter: Secondary | ICD-10-CM | POA: Diagnosis not present

## 2021-01-19 DIAGNOSIS — G20 Parkinson's disease: Secondary | ICD-10-CM | POA: Insufficient documentation

## 2021-01-19 DIAGNOSIS — Z87891 Personal history of nicotine dependence: Secondary | ICD-10-CM | POA: Diagnosis not present

## 2021-01-19 DIAGNOSIS — F0390 Unspecified dementia without behavioral disturbance: Secondary | ICD-10-CM | POA: Insufficient documentation

## 2021-01-19 DIAGNOSIS — Z794 Long term (current) use of insulin: Secondary | ICD-10-CM | POA: Insufficient documentation

## 2021-01-19 DIAGNOSIS — R52 Pain, unspecified: Secondary | ICD-10-CM

## 2021-01-19 DIAGNOSIS — Z79899 Other long term (current) drug therapy: Secondary | ICD-10-CM | POA: Insufficient documentation

## 2021-01-19 DIAGNOSIS — N3000 Acute cystitis without hematuria: Secondary | ICD-10-CM | POA: Diagnosis not present

## 2021-01-19 DIAGNOSIS — Z7902 Long term (current) use of antithrombotics/antiplatelets: Secondary | ICD-10-CM | POA: Insufficient documentation

## 2021-01-19 DIAGNOSIS — W19XXXA Unspecified fall, initial encounter: Secondary | ICD-10-CM

## 2021-01-19 DIAGNOSIS — W1839XA Other fall on same level, initial encounter: Secondary | ICD-10-CM | POA: Insufficient documentation

## 2021-01-19 DIAGNOSIS — I2510 Atherosclerotic heart disease of native coronary artery without angina pectoris: Secondary | ICD-10-CM | POA: Diagnosis not present

## 2021-01-19 DIAGNOSIS — E119 Type 2 diabetes mellitus without complications: Secondary | ICD-10-CM | POA: Insufficient documentation

## 2021-01-19 DIAGNOSIS — S0990XA Unspecified injury of head, initial encounter: Secondary | ICD-10-CM | POA: Diagnosis present

## 2021-01-19 DIAGNOSIS — I10 Essential (primary) hypertension: Secondary | ICD-10-CM | POA: Insufficient documentation

## 2021-01-19 MED ORDER — FENTANYL CITRATE PF 50 MCG/ML IJ SOSY
50.0000 ug | PREFILLED_SYRINGE | Freq: Once | INTRAMUSCULAR | Status: AC
Start: 2021-01-19 — End: 2021-01-20
  Administered 2021-01-20: 50 ug via INTRAVENOUS
  Filled 2021-01-19: qty 1

## 2021-01-19 MED ORDER — LACTATED RINGERS IV BOLUS
1000.0000 mL | Freq: Once | INTRAVENOUS | Status: AC
  Administered 2021-01-20: 1000 mL via INTRAVENOUS

## 2021-01-19 MED ORDER — ACETAMINOPHEN 500 MG PO TABS
1000.0000 mg | ORAL_TABLET | Freq: Once | ORAL | Status: AC
  Administered 2021-01-20: 1000 mg via ORAL
  Filled 2021-01-19: qty 2

## 2021-01-19 NOTE — ED Notes (Signed)
Pt given warm blanket. Bed alarm on.

## 2021-01-19 NOTE — ED Notes (Signed)
Patient transported to X-ray 

## 2021-01-19 NOTE — ED Provider Notes (Signed)
[facility] DEPT Provider Note   CSN: [id number] Arrival date & time: 01/19/21  2312     History Chief Complaint  Patient presents with   Lytle Michaels    Cheryl Morse is a 70 y.o. female.  70 yo F w/ history as documented below who presents to the ED after a fall. Patient states that she was trying to stand up to put a gown on when somehow she ended up on the floor. She states she thinks she hit her head but also thinks she may have syncopized? Has headache now. No other associated symptoms. No pain elsewhere.    Fall      Past Medical History:  Diagnosis Date   Cerebrovascular disease    Coronary artery disease    Dementia ([place])    Diabetes mellitus without complication ([place])    Hyperlipidemia    Parkinson disease ([place])     Patient Active Problem List   Diagnosis Date Noted   Seizure-like activity ([place]) 08/21/2020   Coronary artery disease of native artery of native heart with stable angina pectoris ([place]) 05/25/2020   Essential hypertension 05/25/2020   Dementia ([place])    Parkinson disease ([place])    Cerebrovascular disease    Acquired hypothyroidism 03/11/2017   Anxiety 03/11/2017   Type 2 diabetes mellitus with hyperglycemia, with long-term current use of insulin ([place]) 03/11/2017    Past Surgical History:  Procedure Laterality Date   APPENDECTOMY     EXCISION MASS HEAD     EXPLORATORY LAPAROTOMY     THYROIDECTOMY     TONSILLECTOMY     TUBAL LIGATION       OB History   No obstetric history on file.     Family History  Problem Relation Age of Onset   COPD Mother    Heart attack Father     Social History   Tobacco Use   Smoking status: Former    Packs/day: 1.00    Years: 60.00    Pack years: 60.00    Types: Cigarettes    Quit date: 09/09/2008    Years since quitting: 12.3   Smokeless tobacco: Never  Substance Use Topics   Alcohol use: Never   Drug use: Never    Home Medications Prior to Admission medications    Medication Sig Start Date End Date Taking? Authorizing Provider  acetaminophen (TYLENOL) 500 MG tablet Take 500 mg by mouth See admin instructions. Give 500mg  by mouth three times a day. May also may give 500mg  every 6 hours as needed for  fever 99.5 to 101 F, minor headache or discomfort   Yes [provider]  amLODipine (NORVASC) 10 MG tablet Take 10 mg by mouth daily.   Yes [provider]  carbidopa-levodopa (PARCOPA) 25-100 MG disintegrating tablet Take 1 tablet by mouth 3 (three) times daily.   Yes [provider]  carvedilol (COREG) 6.25 MG tablet Take 1 tablet (6.25 mg total) by mouth 2 (two) times daily. 05/25/20  Yes Chandrasekhar, Mahesh A, MD  cephALEXin (KEFLEX) 500 MG capsule Take 1 capsule (500 mg total) by mouth 4 (four) times daily. 01/20/21  Yes Biance Moncrief, Corene Cornea, MD  clopidogrel (PLAVIX) 75 MG tablet Take 75 mg by mouth daily.   Yes [provider]  famotidine (PEPCID) 20 MG tablet Take 20 mg by mouth daily.   Yes [provider]  insulin glargine (LANTUS SOLOSTAR) 100 UNIT/ML Solostar Pen Inject 15 Units into the skin daily.   Yes [provider]  insulin lispro (HUMALOG) 100 UNIT/ML KwikPen Inject 5 Units into the skin See admin instructions. Inject 5 units into the skin daily at lunch. May also inject 5 units into the skin as needed for blood sugar greater than 450 01/17/21  Yes [provider]  levETIRAcetam (KEPPRA) 500 MG tablet Take 1 tablet (500 mg total) by mouth 2 (two) times daily. 08/22/20  Yes Sanjuan Dame, MD  levothyroxine (SYNTHROID) 175 MCG tablet Take 175 mcg by mouth daily before breakfast.   Yes [provider]  lidocaine (LMX) 4 % cream Apply 1 application topically 2 (two) times daily.   Yes [provider]  memantine (NAMENDA) 5 MG tablet Take 5 mg by mouth 2 (two) times daily.   Yes [provider]  mirtazapine (REMERON) 7.5 MG tablet Take 7.5 mg by mouth at bedtime. 12/20/20   Yes [provider]  nitroGLYCERIN (NITROSTAT) 0.4 MG SL tablet Place 0.4 mg under the tongue See admin instructions. Dissolve 0.4 mg (1 tablet) under the tongue every SIX minutes as needed for chest pain- not to exceed 3 tablets   Yes [provider]  pravastatin (PRAVACHOL) 40 MG tablet Take 40 mg by mouth at bedtime.   Yes [provider]  ROBAFEN 100 MG/5ML syrup Take 200 mg by mouth every 6 (six) hours as needed for cough.   Yes [provider]  traMADol (ULTRAM) 50 MG tablet Take 50 mg by mouth 3 (three) times daily. 05/24/20  Yes [provider]  traZODone (DESYREL) 150 MG tablet Take 150 mg by mouth at bedtime.   Yes [provider]  MI-ACID 200-200-20 MG/5ML suspension Take 30 mLs by mouth every 6 (six) hours as needed for indigestion or heartburn.    [provider]    Allergies    Nsaids, Tomato, Insect extract allergy skin test, and Neosporin original [bacitracin-neomycin-polymyxin]  Review of Systems   Review of Systems  All other systems reviewed and are negative.  Physical Exam Updated Vital Signs BP 125/60   Pulse (!) 58   Temp 98.1 F (36.7 C)   Resp 15   SpO2 95%   Physical Exam Vitals and nursing note reviewed.  Constitutional:      Appearance: She is well-developed.  HENT:     Head: Normocephalic.     Comments: Small hematoma to right occipital area    Mouth/Throat:     Mouth: Mucous membranes are moist.     Pharynx: Oropharynx is clear.  Eyes:     Pupils: Pupils are equal, round, and reactive to light.  Cardiovascular:     Rate and Rhythm: Normal rate and regular rhythm.  Pulmonary:     Effort: No respiratory distress.     Breath sounds: No stridor.  Abdominal:     General: There is no distension.  Musculoskeletal:        General: No tenderness.     Cervical back: Normal range of motion.     Comments: No cervical spine tenderness or Lumbar spine tenderness but did have t spine ttp around  t5/6.  No tenderness or pain with palpation and full ROM of all joints in upper and lower extremities.  No ecchymosis or other signs of trauma on back or extremities.  No Pain with AP or lateral compression of ribs.  NoParacervical ttp, paraspinal ttp   Neurological:     Mental Status: She is alert.    ED Results / Procedures / Treatments   Labs (all labs ordered  are listed, but only abnormal results are displayed) Labs Reviewed  CBC WITH DIFFERENTIAL/PLATELET - Abnormal; Notable for the following components:      Result Value   RBC 3.74 (*)    Hemoglobin 10.1 (*)    HCT 30.4 (*)    All other components within normal limits  COMPREHENSIVE METABOLIC PANEL - Abnormal; Notable for the following components:   Glucose, Bld 203 (*)    Creatinine, Ser 1.10 (*)    Calcium 8.7 (*)    Albumin 3.3 (*)    GFR, Estimated 54 (*)    All other components within normal limits  URINALYSIS, ROUTINE W REFLEX MICROSCOPIC - Abnormal; Notable for the following components:   Color, Urine YELLOW (*)    APPearance CLEAR (*)    Protein, ur 100 (*)    Leukocytes,Ua SMALL (*)    Bacteria, UA MANY (*)    All other components within normal limits    EKG EKG Interpretation  Date/Time:  Thursday January 20 2021 00:01:11 EDT Ventricular Rate:  75 PR Interval:  140 QRS Duration: 91 QT Interval:  414 QTC Calculation: 463 R Axis:   15 Text Interpretation: Sinus rhythm Confirmed by Merrily Pew [phone]) on 01/20/2021 1:10:15 AM  Radiology DG Chest 1 View  Result Date: 01/20/2021 CLINICAL DATA:  Fall, chest pain EXAM: CHEST  1 VIEW COMPARISON:  None. FINDINGS: Lungs are clear.  No pleural effusion or pneumothorax. The heart is top-normal in size.  Coronary vascular calcifications. Surgical clips in the bilateral neck. IMPRESSION: No evidence of acute cardiopulmonary disease. Electronically Signed   By: Julian Hy M.D.   On: 01/20/2021 00:11   DG Thoracic Spine 2 View  Result Date:  01/20/2021 CLINICAL DATA:  Fall, upper back pain EXAM: THORACIC SPINE 2 VIEWS COMPARISON:  None. FINDINGS: Normal thoracic kyphosis. No evidence of fracture or dislocation. Vertebral body heights are maintained. Visualized lungs are clear. Coronary vascular calcifications. Cholecystectomy clips. IMPRESSION: Negative. Electronically Signed   By: Julian Hy M.D.   On: 01/20/2021 00:09   CT Head Wo Contrast  Result Date: 01/20/2021 CLINICAL DATA:  Fall, head trauma, on Plavix EXAM: CT HEAD WITHOUT CONTRAST TECHNIQUE: Contiguous axial images were obtained from the base of the skull through the vertex without intravenous contrast. COMPARISON:  08/21/2020 FINDINGS: Brain: No evidence of acute infarction, hemorrhage, hydrocephalus, extra-axial collection or mass lesion/mass effect. Global cortical and central atrophy.  Stable ventricular prominence. Subcortical white matter and periventricular small vessel ischemic changes. Vascular: Intracranial atherosclerosis. Skull: Normal. Negative for fracture or focal lesion. Sinuses/Orbits: The visualized paranasal sinuses are essentially clear. The mastoid air cells are unopacified. Other: None. IMPRESSION: No evidence of acute intracranial abnormality. Atrophy with small vessel ischemic changes. Electronically Signed   By: Julian Hy M.D.   On: 01/20/2021 00:13    Procedures Procedures   Medications Ordered in ED Medications  lactated ringers bolus 1,000 mL (0 mLs Intravenous Stopped 01/20/21 0356)  acetaminophen (TYLENOL) tablet 1,000 mg (1,000 mg Oral Given 01/20/21 0008)  fentaNYL (SUBLIMAZE) injection 50 mcg (50 mcg Intravenous Given 01/20/21 0007)  cefTRIAXone (ROCEPHIN) 2 g in sodium chloride 0.9 % 100 mL IVPB (0 g Intravenous Stopped 01/20/21 0410)    ED Course  I have reviewed the triage vital signs and the nursing notes.  Pertinent labs & imaging results that were available during my care of the patient were reviewed by me and considered in  my medical decision making (see chart for details).    MDM Rules/Calculators/A&P  No fractures or head injury. Found to have UTI. Started abx. Dc on same.   Final Clinical Impression(s) / ED Diagnoses Final diagnoses:  Fall, initial encounter  Acute cystitis without hematuria    Rx / DC Orders ED Discharge Orders          Ordered    cephALEXin (KEFLEX) 500 MG capsule  4 times daily        01/20/21 0434             Maite Burlison, Corene Cornea, MD 01/20/21 [phone]

## 2021-01-19 NOTE — ED Triage Notes (Signed)
Patient BIB EMS from SNF [facility]) c/o mechanical Fall. Per report pt fell on her buttocks while using her walker. Pt Denies LOC. Patient denies hitting her head in the floor. Pt is on plavix.

## 2021-01-20 DIAGNOSIS — S0003XA Contusion of scalp, initial encounter: Secondary | ICD-10-CM | POA: Diagnosis not present

## 2021-01-20 LAB — COMPREHENSIVE METABOLIC PANEL
ALT: <5 U/L (ref 0–44)
AST: 19 U/L (ref 15–41)
Albumin: 3.3 g/dL — ABNORMAL LOW (ref 3.5–5.0)
Alkaline Phosphatase: 78 U/L (ref 38–126)
Anion gap: 8 (ref 5–15)
BUN: 16 mg/dL (ref 8–23)
CO2: 24 mmol/L (ref 22–32)
Calcium: 8.7 mg/dL — ABNORMAL LOW (ref 8.9–10.3)
Chloride: 106 mmol/L (ref 98–111)
Creatinine, Ser: 1.1 mg/dL — ABNORMAL HIGH (ref 0.44–1.00)
GFR, Estimated: 54 mL/min — ABNORMAL LOW (ref >60)
Glucose, Bld: 203 mg/dL — ABNORMAL HIGH (ref 70–99)
Potassium: 4.4 mmol/L (ref 3.5–5.1)
Sodium: 138 mmol/L (ref 135–145)
Total Bilirubin: 0.8 mg/dL (ref 0.3–1.2)
Total Protein: 6.5 g/dL (ref 6.5–8.1)

## 2021-01-20 LAB — CBC WITH DIFFERENTIAL/PLATELET
Abs Immature Granulocytes: 0.02 10*3/uL (ref 0.00–0.07)
Basophils Absolute: 0 10*3/uL (ref 0.0–0.1)
Basophils Relative: 0 %
Eosinophils Absolute: 0.1 10*3/uL (ref 0.0–0.5)
Eosinophils Relative: 1 %
HCT: 30.4 % — ABNORMAL LOW (ref 36.0–46.0)
Hemoglobin: 10.1 g/dL — ABNORMAL LOW (ref 12.0–15.0)
Immature Granulocytes: 0 %
Lymphocytes Relative: 11 %
Lymphs Abs: 0.8 10*3/uL (ref 0.7–4.0)
MCH: 27 pg (ref 26.0–34.0)
MCHC: 33.2 g/dL (ref 30.0–36.0)
MCV: 81.3 fL (ref 80.0–100.0)
Monocytes Absolute: 0.4 10*3/uL (ref 0.1–1.0)
Monocytes Relative: 6 %
Neutro Abs: 6.4 10*3/uL (ref 1.7–7.7)
Neutrophils Relative %: 82 %
Platelets: 221 10*3/uL (ref 150–400)
RBC: 3.74 MIL/uL — ABNORMAL LOW (ref 3.87–5.11)
RDW: 14.1 % (ref 11.5–15.5)
WBC: 7.8 10*3/uL (ref 4.0–10.5)
nRBC: 0 % (ref 0.0–0.2)

## 2021-01-20 LAB — URINALYSIS, ROUTINE W REFLEX MICROSCOPIC
Bilirubin Urine: NEGATIVE
Glucose, UA: NEGATIVE mg/dL
Hgb urine dipstick: NEGATIVE
Ketones, ur: NEGATIVE mg/dL
Nitrite: NEGATIVE
Protein, ur: 100 mg/dL — AB
Specific Gravity, Urine: 1.01 (ref 1.005–1.030)
pH: 6 (ref 5.0–8.0)

## 2021-01-20 MED ORDER — SODIUM CHLORIDE 0.9 % IV SOLN
2.0000 g | Freq: Once | INTRAVENOUS | Status: AC
  Administered 2021-01-20: 2 g via INTRAVENOUS
  Filled 2021-01-20: qty 20

## 2021-01-20 MED ORDER — CEPHALEXIN 500 MG PO CAPS: 500.0000 mg | ORAL_CAPSULE | Freq: Four times a day (QID) | ORAL | 0 refills | Status: AC

## 2021-01-20 NOTE — ED Notes (Signed)
Purewick placed on pt. 

## 2021-01-20 NOTE — ED Notes (Signed)
Patient transported to X-ray 

## 2021-01-20 NOTE — ED Notes (Signed)
Report given to Cheryl Morse at [facility] called for transportation

## 2022-09-27 IMAGING — CT CT HEAD CODE STROKE
3 series · 15 of 47 positions shown, 18 images · non-contrast
Comparison: Brain MRI 05/17/2019.  Head CT 06/01/2020.

CLINICAL DATA: Code stroke. 70-year-old female with altered mental
status.

EXAM:
CT HEAD WITHOUT CONTRAST
TECHNIQUE: Contiguous axial images were obtained from the base of the skull
through the vertex without intravenous contrast.

[Series 3: head 5.0 st · axial · 0.41mm/px · z∈[-111,+34]mm · 9 of 35 slices shown, 12 images]
[im 3/35  brain]
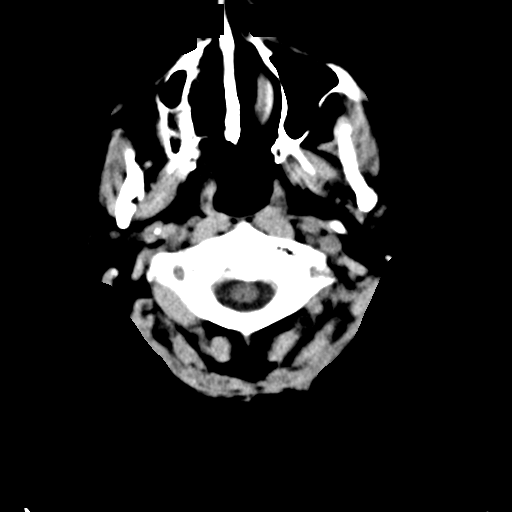
[im 3/35  bone]
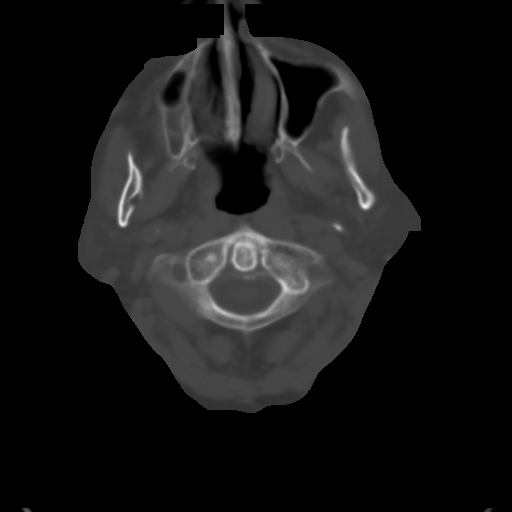
[im 6/35  brain]
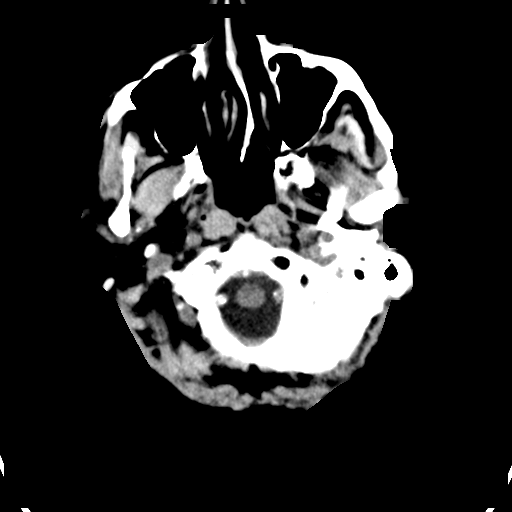
[im 10/35  brain]
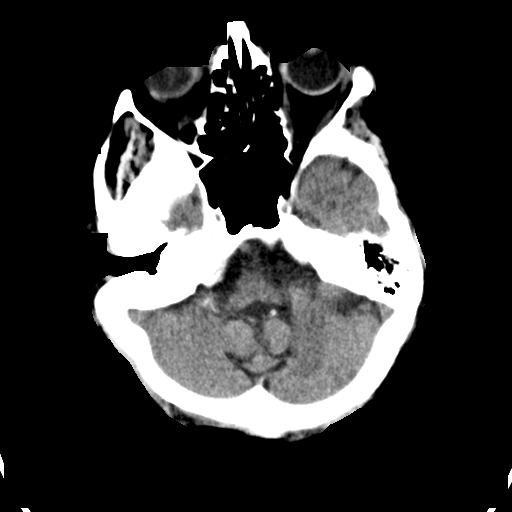
[im 13/35  brain]
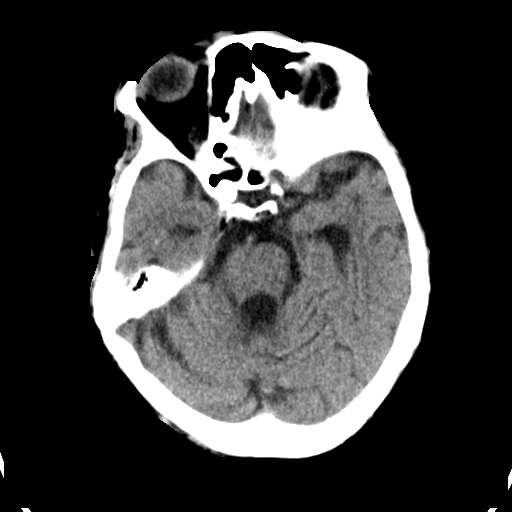
[im 18/35  brain]
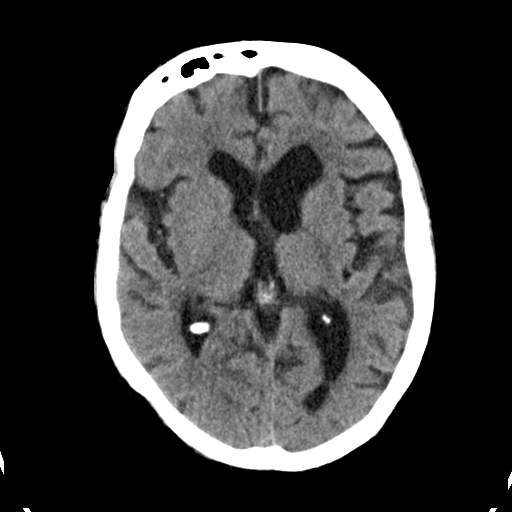
[im 18/35  bone]
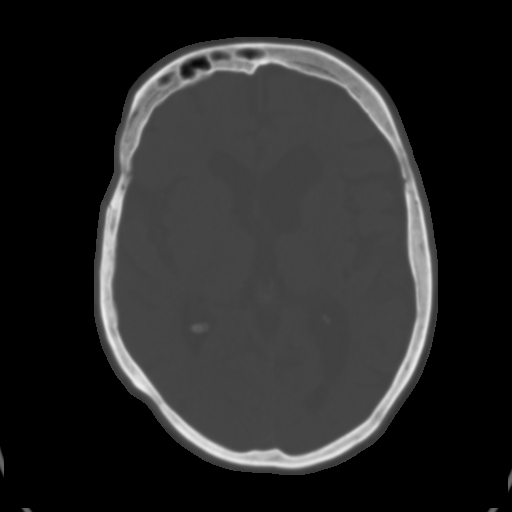
[im 22/35  brain]
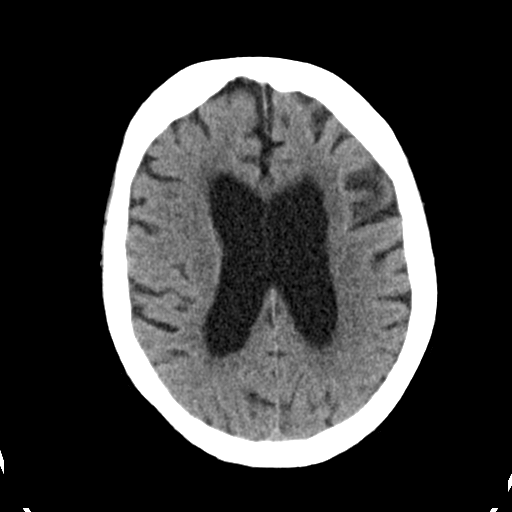
[im 25/35  brain]
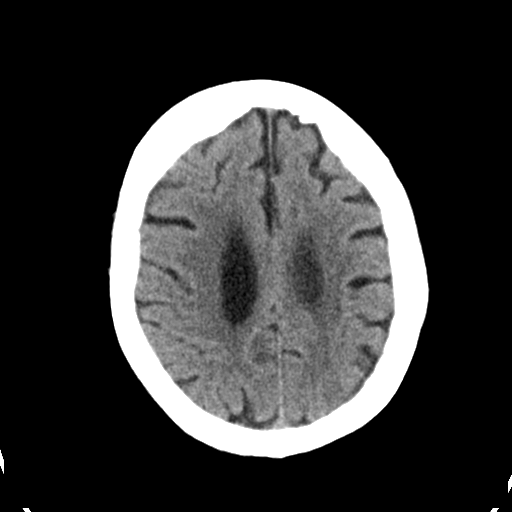
[im 29/35  brain]
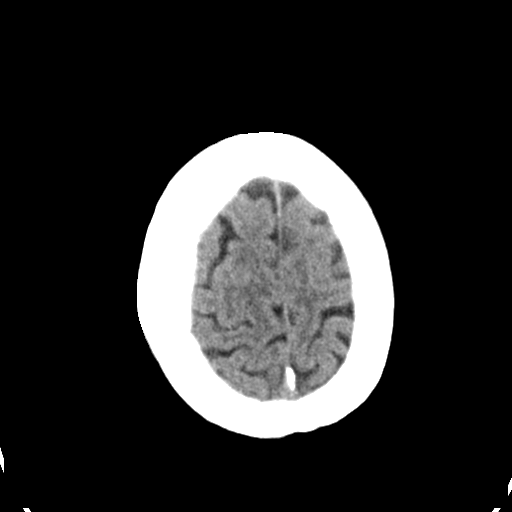
[im 32/35  brain]
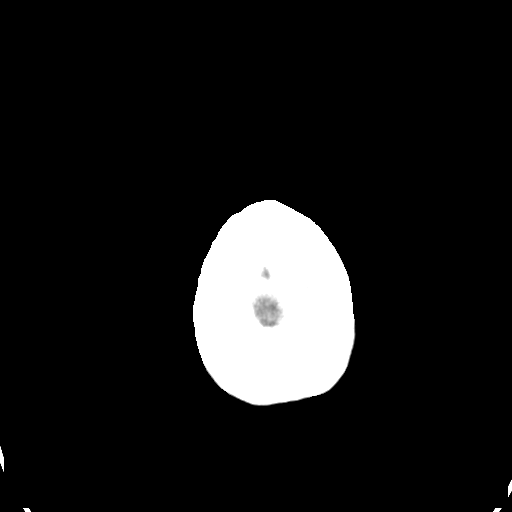
[im 32/35  bone]
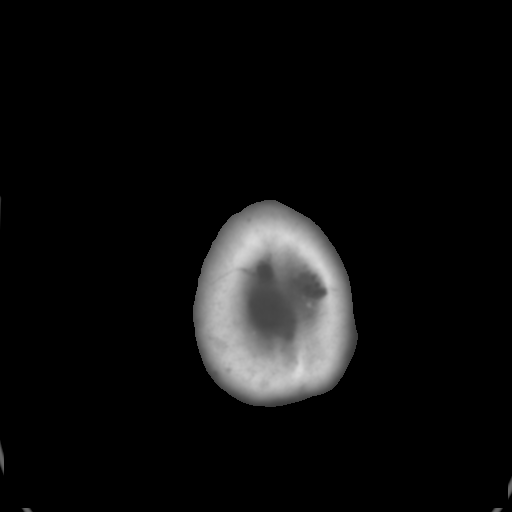

[Series 5: head 3.0 cor st · coronal · 0.32mm/px · 3 of 67 slices shown]
[im 23/67  brain]
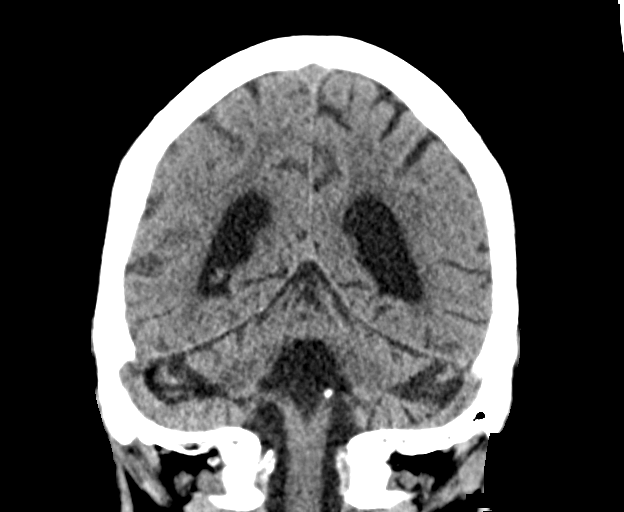
[im 30/67  brain]
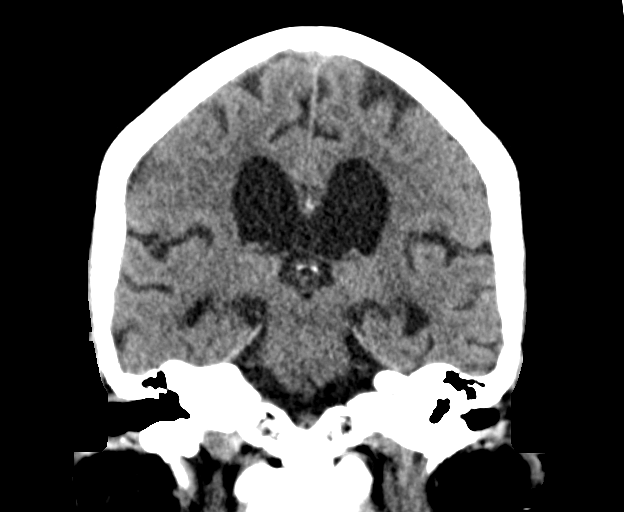
[im 37/67  brain]
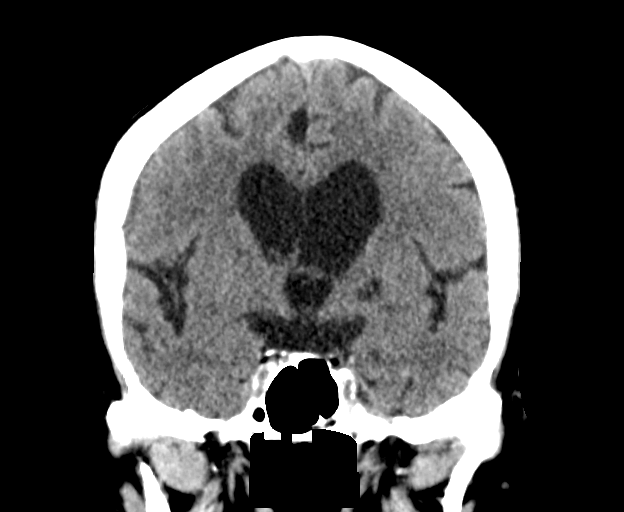

[Series 6: head 3.0 sag st · sagittal · 0.30mm/px · 3 of 60 slices shown]
[im 20/60  brain]
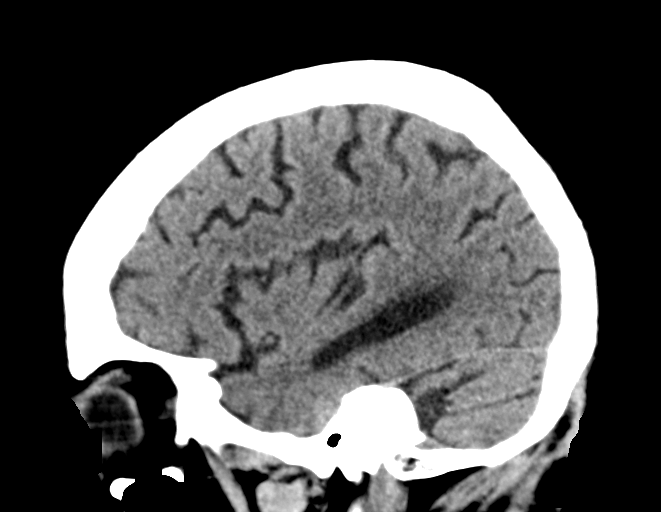
[im 30/60  brain]
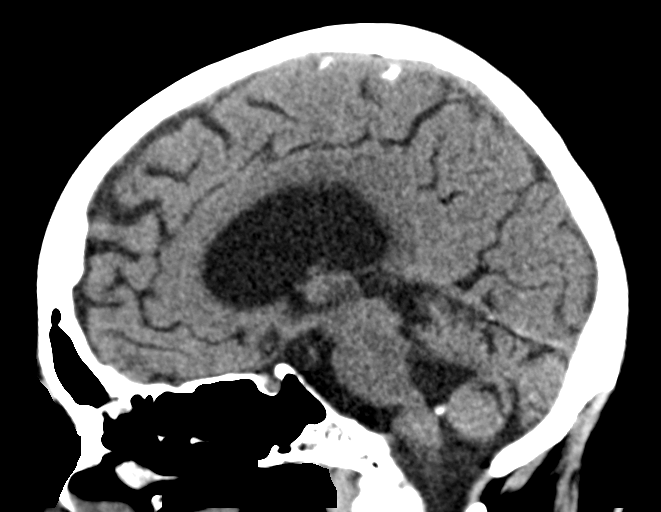
[im 40/60  brain]
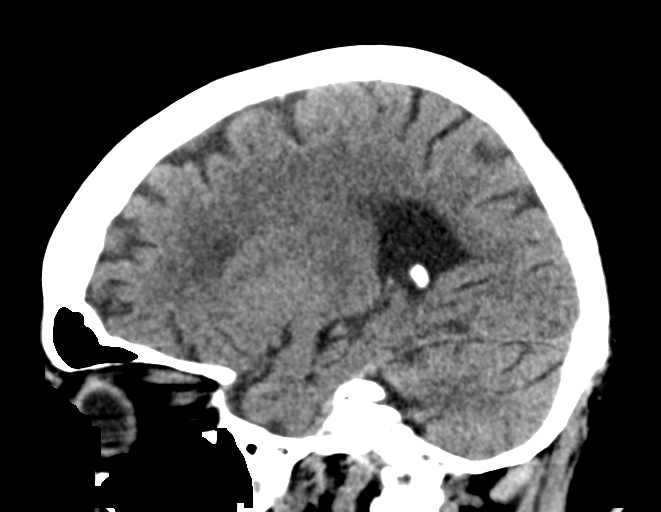

[15 of 47 positions shown; findings below may reference images not displayed]

FINDINGS: Brain: Probable chronic lacunar infarct left basal ganglia and
internal capsule is stable. Small chronic infarct in the left
inferior cerebellum is stable.

Gray-white matter differentiation elsewhere is stable and within
normal limits. Chronic brainstem volume loss. No midline shift,
ventriculomegaly, mass effect, evidence of mass lesion, intracranial
hemorrhage or evidence of cortically based acute infarction.

Vascular: Calcified atherosclerosis at the skull base. No suspicious
intracranial vascular hyperdensity.

Skull: Stable, negative.

Sinuses/Orbits: Visualized paranasal sinuses and mastoids are stable
and well aerated.

Other: Visualized orbits and scalp soft tissues are negative aside
from chronic left suboccipital scalp scarring.

ASPECTS (Alberta Stroke Program Early CT Score)

Total score (0-10 with 10 being normal): 10 (chronic
encephalomalacia).
IMPRESSION: 1. Stable CT appearance of the brain since [REDACTED] with chronic
infarcts in the left basal ganglia and left cerebellum.
2.  ASPECTS 10.
3. These results were communicated to Dr. Bernal at [DATE] on
08/21/2020 by text page via the [employer] messaging system.

## 2023-07-13 ENCOUNTER — Other Ambulatory Visit: Payer: Self-pay | Admitting: Nurse Practitioner

## 2023-07-13 DIAGNOSIS — Z1231 Encounter for screening mammogram for malignant neoplasm of breast: Secondary | ICD-10-CM
